# Patient Record
Sex: Male | Born: 1965 | Race: White | Hispanic: No | Marital: Married | Smoking: Former smoker
Health system: Southern US, Community
[De-identification: ages and names within clinical notes are randomized; demographics above are authoritative.]

## PROBLEM LIST (undated history)

## (undated) DIAGNOSIS — I509 Heart failure, unspecified: Secondary | ICD-10-CM

---

## 2020-05-09 DIAGNOSIS — I10 Essential (primary) hypertension: Secondary | ICD-10-CM

## 2020-05-09 DIAGNOSIS — R079 Chest pain, unspecified: Secondary | ICD-10-CM

## 2020-05-09 DIAGNOSIS — I361 Nonrheumatic tricuspid (valve) insufficiency: Secondary | ICD-10-CM | POA: Diagnosis not present

## 2020-05-09 DIAGNOSIS — G4733 Obstructive sleep apnea (adult) (pediatric): Secondary | ICD-10-CM | POA: Diagnosis not present

## 2020-05-09 DIAGNOSIS — J449 Chronic obstructive pulmonary disease, unspecified: Secondary | ICD-10-CM

## 2020-05-09 DIAGNOSIS — I272 Pulmonary hypertension, unspecified: Secondary | ICD-10-CM

## 2020-05-10 DIAGNOSIS — G4733 Obstructive sleep apnea (adult) (pediatric): Secondary | ICD-10-CM | POA: Diagnosis not present

## 2020-05-10 DIAGNOSIS — J449 Chronic obstructive pulmonary disease, unspecified: Secondary | ICD-10-CM | POA: Diagnosis not present

## 2020-05-10 DIAGNOSIS — I272 Pulmonary hypertension, unspecified: Secondary | ICD-10-CM | POA: Diagnosis not present

## 2020-05-10 DIAGNOSIS — R079 Chest pain, unspecified: Secondary | ICD-10-CM | POA: Diagnosis not present

## 2020-05-14 ENCOUNTER — Ambulatory Visit (HOSPITAL_COMMUNITY)
Admission: RE | Admit: 2020-05-14 | Discharge: 2020-05-14 | Disposition: A | Discharge status: Home / Self Care | Payer: 59 | Source: Ambulatory Visit | Attending: Internal Medicine | Admitting: Internal Medicine

## 2020-05-14 ENCOUNTER — Other Ambulatory Visit: Payer: Self-pay

## 2020-05-14 VITALS — BP 135/75 | HR 70 | Wt 350.6 lb

## 2020-05-14 DIAGNOSIS — R0602 Shortness of breath: Secondary | ICD-10-CM | POA: Insufficient documentation

## 2020-05-14 DIAGNOSIS — I2721 Secondary pulmonary arterial hypertension: Secondary | ICD-10-CM

## 2020-05-14 DIAGNOSIS — E119 Type 2 diabetes mellitus without complications: Secondary | ICD-10-CM | POA: Diagnosis not present

## 2020-05-14 DIAGNOSIS — Z7984 Long term (current) use of oral hypoglycemic drugs: Secondary | ICD-10-CM | POA: Insufficient documentation

## 2020-05-14 DIAGNOSIS — Z79899 Other long term (current) drug therapy: Secondary | ICD-10-CM | POA: Diagnosis not present

## 2020-05-14 DIAGNOSIS — I10 Essential (primary) hypertension: Secondary | ICD-10-CM | POA: Diagnosis not present

## 2020-05-14 DIAGNOSIS — I272 Pulmonary hypertension, unspecified: Secondary | ICD-10-CM

## 2020-05-14 DIAGNOSIS — G4733 Obstructive sleep apnea (adult) (pediatric): Secondary | ICD-10-CM | POA: Insufficient documentation

## 2020-05-14 DIAGNOSIS — Z87891 Personal history of nicotine dependence: Secondary | ICD-10-CM | POA: Diagnosis not present

## 2020-05-14 DIAGNOSIS — J449 Chronic obstructive pulmonary disease, unspecified: Secondary | ICD-10-CM | POA: Diagnosis not present

## 2020-05-14 LAB — BASIC METABOLIC PANEL
Anion gap: 8 (ref 5–15)
BUN: 23 mg/dL — ABNORMAL HIGH (ref 6–20)
CO2: 25 mmol/L (ref 22–32)
Calcium: 9.2 mg/dL (ref 8.9–10.3)
Chloride: 102 mmol/L (ref 98–111)
Creatinine, Ser: 1.03 mg/dL (ref 0.61–1.24)
GFR calc Af Amer: >60 mL/min (ref >60)
GFR calc non Af Amer: >60 mL/min (ref >60)
Glucose, Bld: 119 mg/dL — ABNORMAL HIGH (ref 70–99)
Potassium: 4.3 mmol/L (ref 3.5–5.1)
Sodium: 135 mmol/L (ref 135–145)

## 2020-05-14 LAB — CBC
HCT: 49.6 % (ref 39.0–52.0)
Hemoglobin: 15.3 g/dL (ref 13.0–17.0)
MCH: 27.7 pg (ref 26.0–34.0)
MCHC: 30.8 g/dL (ref 30.0–36.0)
MCV: 89.9 fL (ref 80.0–100.0)
Platelets: 224 10*3/uL (ref 150–400)
RBC: 5.52 MIL/uL (ref 4.22–5.81)
RDW: 16.5 % — ABNORMAL HIGH (ref 11.5–15.5)
WBC: 9.6 10*3/uL (ref 4.0–10.5)
nRBC: 0 % (ref 0.0–0.2)

## 2020-05-14 LAB — BRAIN NATRIURETIC PEPTIDE: B Natriuretic Peptide: 688.2 pg/mL — ABNORMAL HIGH (ref 0.0–100.0)

## 2020-05-14 NOTE — Progress Notes (Signed)
Pt ambulated in hallway, O2 sat remained 93% on RA with no desats noted.

## 2020-05-14 NOTE — Patient Instructions (Signed)
Labs done today, we will call you for abnormal results  Heart Catheterization scheduled for Thursday 7/29, see instructions below  Your physician recommends that you schedule a follow-up appointment in: 2-3 months  If you have any questions or concerns before your next appointment please send [place] a message through [place] or call our office at [phone].    TO LEAVE A MESSAGE FOR THE NURSE SELECT OPTION 2, PLEASE LEAVE A MESSAGE INCLUDING: . YOUR NAME . DATE OF BIRTH . CALL BACK NUMBER . REASON FOR CALL**this is important as we prioritize the call backs  YOU WILL RECEIVE A CALL BACK THE SAME DAY AS LONG AS YOU CALL BEFORE 4:00 PM  At the [facility], you and your health needs are our priority. As part of our continuing mission to provide you with exceptional heart care, we have created designated Provider Care Teams. These Care Teams include your primary Cardiologist (physician) and Advanced Practice Providers (APPs- Physician Assistants and Nurse Practitioners) who all work together to provide you with the care you need, when you need it.   You may see any of the following providers on your designated Care Team at your next follow up: Marland Kitchen Dr [name] . Dr Marca Ancona . Tonye Becket, NP . Robbie Lis, PA . Karle Plumber, PharmD   Please be sure to bring in all your medications bottles to every appointment.     CATHETERIZATION INSTRUCTIONS:  You are scheduled for a Cardiac Catheterization on Thursday, July 29 with Dr. [name].  1. Please arrive at the [facility] (Main Entrance A) at [facility]: [address] [place], [place] [address] at 11:30 AM (This time is two hours before your procedure to ensure your preparation). Free valet parking service is available.   Special note: Every effort is made to have your procedure done on time. Please understand that emergencies sometimes delay scheduled procedures.  2. Diet: Do not eat solid  foods after midnight.  The patient may have clear liquids until 5am upon the day of the procedure.  3. Labs: DONE TODAY  4. Medication instructions in preparation for your procedure:   THUR AM DO NOT TAKE:   FUROSEMIDE, SPIRONOLACTONE OR METFORMIN   DO NOT TAKE METFORMIN THUR, FRI, OR SAT, RESTART ON Sunday 8/1  On the morning of your procedure, take any morning medicines NOT listed above.  You may use sips of water.  5. Plan for one night stay--bring personal belongings. 6. Bring a current list of your medications and current insurance cards. 7. You MUST have a responsible person to drive you home. 8. Someone MUST be with you the first 24 hours after you arrive home or your discharge will be delayed. 9. Please wear clothes that are easy to get on and off and wear slip-on shoes.  Thank you for allowing [place] to care for you!   -- [facility]

## 2020-05-14 NOTE — Progress Notes (Signed)
ADVANCED HF CLINIC CONSULT NOTE  Referring Provider: Arnette Felts PA-C   HPI:  Bradley English is a 54 y/o male with HTN, morbid obesity, COPD, severe OSA (complaint with OSA), DM2, former tobacco use   referred by Arnette Felts PA-C for further evaluation of Pulmonary Hypertension found on echo.   Smoked 1.5 ppd x 30 years quit 1/21   Has worked in Landscape architect with re-upholstering. In October 2020 started to notice more LE swelling and SOB. Went to ER at [facility]. Had chest CT (negative for PE) and LE u/s negative.   Has been followed by Arnette Felts. BP much better controlled. Swelling improved but not resolved. Was admitted to [facility] last week with worsening dyspnea and CP. Troponin normal. Underwent IV diuresis.  Lasix increased by Dr. Dulce Sellar from 40 daily to 80 daily.  Echo 7/21: LVEF 55-60% RV severely dilated with moderately decreased function. Flattened septum   Says he gets SOB with any activity. When he is active starts feeling pressure in his neck and shoulders. If he continues gets presyncopal. Wears CPAP every night but has not done a download in > 6 months. Does not wear O2. Has a pulse ox and it is usually 91-93%. Edema much better. No h/o DVT/PE. Has always been overweight. A few years was 450 pounds and lost 100 pounds using an appetite suppressant (phenermine)          Review of Systems: [y] = yes, [ ]  = no   General: Weight gain [ ] ; Weight loss [ ] ; Anorexia [ ] ; Fatigue [ ] ; Fever [ ] ; Chills [ ] ; Weakness [ ]   Cardiac: Chest pain/pressure [ ] ; Resting SOB [ ] ; Exertional SOB [ ] ; Orthopnea [ ] ; Pedal Edema [ ] ; Palpitations [ ] ; Syncope [ ] ; Presyncope [ ] ; Paroxysmal nocturnal dyspnea[ ]   Pulmonary: Cough [ ] ; Wheezing[ ] ; Hemoptysis[ ] ; Sputum [ ] ; Snoring [ ]   GI: Vomiting[ ] ; Dysphagia[ ] ; Melena[ ] ; Hematochezia [ ] ; Heartburn[ ] ; Abdominal pain [ ] ; Constipation [ ] ; Diarrhea [ ] ; BRBPR [ ]   GU: Hematuria[ ] ; Dysuria [ ] ; Nocturia[ ]   Vascular:  Pain in legs with walking [ ] ; Pain in feet with lying flat [ ] ; Non-healing sores [ ] ; Stroke [ ] ; TIA [ ] ; Slurred speech [ ] ;  Neuro: Headaches[ ] ; Vertigo[ ] ; Seizures[ ] ; Paresthesias[ ] ;Blurred vision [ ] ; Diplopia [ ] ; Vision changes [ ]   Ortho/Skin: Arthritis [ ] ; Joint pain [ ] ; Muscle pain [ ] ; Joint swelling [ ] ; Back Pain [ ] ; Rash [ ]   Psych: Depression[ ] ; Anxiety[ ]   Heme: Bleeding problems [ ] ; Clotting disorders [ ] ; Anemia [ ]   Endocrine: Diabetes [ ] ; Thyroid dysfunction[ ]    PMHx:  1. Obesity 2. HTN 3. Pulmonary HTN with RV failure/cor pulmonale 4. Severe OSA  Current Outpatient Medications  Medication Sig Dispense Refill  . atorvastatin (LIPITOR) 10 MG tablet Take 1 tablet by mouth daily.    lisinopril (ZESTRIL) 20 MG tablet Take 1 tablet by mouth daily.    BREO ELLIPTA 100-25 MCG/INH AEPB Inhale 1 puff into the lungs daily.    . carvedilol (COREG) 12.5 MG tablet Take 12.5 mg by mouth 2 (two) times daily.    . furosemide (LASIX) 80 MG tablet Take 80 mg by mouth daily.    . metFORMIN (GLUCOPHAGE) 1000 MG tablet Take 1,000 mg by mouth 2 (two) times daily.    spironolactone (ALDACTONE) 50 MG tablet  Take 50 mg by mouth 2 (two) times daily.    . TRULICITY 1.5 MG/0.5ML SOPN SMARTSIG:1 Pre-Filled Pen Syringe SUB-Q Once a Week    . Vitamin D, Ergocalciferol, (DRISDOL) 1.25 MG (50000 UNIT) CAPS capsule Take 50,000 Units by mouth once a week.     No current facility-administered medications for this encounter.    No Known Allergies    Social History   Socioeconomic History  . Marital status: Married    Spouse name: Not on file  . Number of children: Not on file  . Years of education: Not on file  . Highest education level: Not on file  Occupational History  . Not on file  Tobacco Use  . Smoking status: Not on file  Substance and Sexual Activity  . Alcohol use: Not on file  . Drug use: Not on file  . Sexual activity: Not on file  Other Topics Concern    . Not on file  Social History Narrative  . Not on file   Social Determinants of Health   Financial Resource Strain:   . Difficulty of Paying Living Expenses:   Food Insecurity:   . Worried About Running Out of Food in the Last Year:   . Ran Out of Food in the Last Year:   Transportation Needs:   . Lack of Transportation (Medical):   . Lack of Transportation (Non-Medical):   Physical Activity:   . Days of Exercise per Week:   . Minutes of Exercise per Session:   Stress:   . Feeling of Stress :   Social Connections:   . Frequency of Communication with Friends and Family:   . Frequency of Social Gatherings with Friends and Family:   . Attends Religious Services:   . Active Member of Clubs or Organizations:   . Attends Club or Organization Meetings:   . Marital Status:   Intimate Partner Violence:   . Fear of Current or Ex-Partner:   . Emotionally Abused:   . Physically Abused:   . Sexually Abused:     FHx:  M died to cancer D died due to MVA PGF  Died to HF Brother stage IV kidney CA Sister died due to ETOH cirrhosis  Vitals:   05/14/20 1414  BP: (!) 135/75  Pulse: 70  SpO2: 94%  Weight: (!) 159 kg (350 lb 9.6 oz)    PHYSICAL EXAM: General:  Obese male . No respiratory difficulty HEENT: normal Neck: supple. JVP 8-10. Carotids 2+ bilat; no bruits. No lymphadenopathy or thryomegaly appreciated. Cor: PMI nondisplaced. Regular rate & rhythm. No rubs, gallops or murmurs. Lungs: clear Abdomen: obese soft, nontender, nondistended. No hepatosplenomegaly. No bruits or masses. Good bowel sounds. Extremities: no cyanosis, clubbing, rash, edema. prominent varicose veins Neuro: alert & oriented x 3, cranial nerves grossly intact. moves all 4 extremities w/o difficulty. Affect pleasant.    ASSESSMENT & PLAN:  1. Pulmonary HTN/cor pulmonale - suspect mostly WHO Group III but may  Have component of WHO Group 2 +/- anorexigen meds (Phenteramine) - CT chest and LE  dopplers negative for DVT/PE - continue CPAP - check sats with hall walk and overnight oximetry  - see if we can get PFTs from Pulmonary - will schedule R/L cath - Discussed need for weight loss with [place] diet.   2. Morbid obesity - Discussed need for weight loss with [place] diet.   3. HTN - Blood pressure well controlled. Continue current regimen.   

## 2020-05-14 NOTE — H&P (View-Only) (Signed)
ADVANCED HF CLINIC CONSULT NOTE  Referring Provider: Arnette Felts PA-C   HPI:  Bradley English is a 54 y/o male with HTN, morbid obesity, COPD, severe OSA (complaint with OSA), DM2, former tobacco use   referred by Arnette Felts PA-C for further evaluation of Pulmonary Hypertension found on echo.   Smoked 1.5 ppd x 30 years quit 1/21   Has worked in Landscape architect with re-upholstering. In October 2020 started to notice more LE swelling and SOB. Went to ER at [facility]. Had chest CT (negative for PE) and LE u/s negative.   Has been followed by Arnette Felts. BP much better controlled. Swelling improved but not resolved. Was admitted to [facility] last week with worsening dyspnea and CP. Troponin normal. Underwent IV diuresis.  Lasix increased by Dr. Dulce Sellar from 40 daily to 80 daily.  Echo 7/21: LVEF 55-60% RV severely dilated with moderately decreased function. Flattened septum   Says he gets SOB with any activity. When he is active starts feeling pressure in his neck and shoulders. If he continues gets presyncopal. Wears CPAP every night but has not done a download in > 6 months. Does not wear O2. Has a pulse ox and it is usually 91-93%. Edema much better. No h/o DVT/PE. Has always been overweight. A few years was 450 pounds and lost 100 pounds using an appetite suppressant (phenermine)          Review of Systems: [y] = yes, [ ]  = no   General: Weight gain [ ] ; Weight loss [ ] ; Anorexia [ ] ; Fatigue [ ] ; Fever [ ] ; Chills [ ] ; Weakness [ ]   Cardiac: Chest pain/pressure [ ] ; Resting SOB [ ] ; Exertional SOB [ ] ; Orthopnea [ ] ; Pedal Edema [ ] ; Palpitations [ ] ; Syncope [ ] ; Presyncope [ ] ; Paroxysmal nocturnal dyspnea[ ]   Pulmonary: Cough [ ] ; Wheezing[ ] ; Hemoptysis[ ] ; Sputum [ ] ; Snoring [ ]   GI: Vomiting[ ] ; Dysphagia[ ] ; Melena[ ] ; Hematochezia [ ] ; Heartburn[ ] ; Abdominal pain [ ] ; Constipation [ ] ; Diarrhea [ ] ; BRBPR [ ]   GU: Hematuria[ ] ; Dysuria [ ] ; Nocturia[ ]   Vascular:  Pain in legs with walking [ ] ; Pain in feet with lying flat [ ] ; Non-healing sores [ ] ; Stroke [ ] ; TIA [ ] ; Slurred speech [ ] ;  Neuro: Headaches[ ] ; Vertigo[ ] ; Seizures[ ] ; Paresthesias[ ] ;Blurred vision [ ] ; Diplopia [ ] ; Vision changes [ ]   Ortho/Skin: Arthritis [ ] ; Joint pain [ ] ; Muscle pain [ ] ; Joint swelling [ ] ; Back Pain [ ] ; Rash [ ]   Psych: Depression[ ] ; Anxiety[ ]   Heme: Bleeding problems [ ] ; Clotting disorders [ ] ; Anemia [ ]   Endocrine: Diabetes [ ] ; Thyroid dysfunction[ ]    PMHx:  1. Obesity 2. HTN 3. Pulmonary HTN with RV failure/cor pulmonale 4. Severe OSA  Current Outpatient Medications  Medication Sig Dispense Refill  . atorvastatin (LIPITOR) 10 MG tablet Take 1 tablet by mouth daily.    lisinopril (ZESTRIL) 20 MG tablet Take 1 tablet by mouth daily.    BREO ELLIPTA 100-25 MCG/INH AEPB Inhale 1 puff into the lungs daily.    . carvedilol (COREG) 12.5 MG tablet Take 12.5 mg by mouth 2 (two) times daily.    . furosemide (LASIX) 80 MG tablet Take 80 mg by mouth daily.    . metFORMIN (GLUCOPHAGE) 1000 MG tablet Take 1,000 mg by mouth 2 (two) times daily.    spironolactone (ALDACTONE) 50 MG tablet  Take 50 mg by mouth 2 (two) times daily.    . TRULICITY 1.5 MG/0.5ML SOPN SMARTSIG:1 Pre-Filled Pen Syringe SUB-Q Once a Week    . Vitamin D, Ergocalciferol, (DRISDOL) 1.25 MG (50000 UNIT) CAPS capsule Take 50,000 Units by mouth once a week.     No current facility-administered medications for this encounter.    No Known Allergies    Social History   Socioeconomic History  . Marital status: Married    Spouse name: Not on file  . Number of children: Not on file  . Years of education: Not on file  . Highest education level: Not on file  Occupational History  . Not on file  Tobacco Use  . Smoking status: Not on file  Substance and Sexual Activity  . Alcohol use: Not on file  . Drug use: Not on file  . Sexual activity: Not on file  Other Topics Concern    . Not on file  Social History Narrative  . Not on file   Social Determinants of Health   Financial Resource Strain:   . Difficulty of Paying Living Expenses:   Food Insecurity:   . Worried About Programme researcher, broadcasting/film/video in the Last Year:   . Barista in the Last Year:   Transportation Needs:   . Freight forwarder (Medical):   Marland Kitchen Lack of Transportation (Non-Medical):   Physical Activity:   . Days of Exercise per Week:   . Minutes of Exercise per Session:   Stress:   . Feeling of Stress :   Social Connections:   . Frequency of Communication with Friends and Family:   . Frequency of Social Gatherings with Friends and Family:   . Attends Religious Services:   . Active Member of Clubs or Organizations:   . Attends Banker Meetings:   Marland Kitchen Marital Status:   Intimate Partner Violence:   . Fear of Current or Ex-Partner:   . Emotionally Abused:   Marland Kitchen Physically Abused:   . Sexually Abused:     FHx:  M died to cancer D died due to MVA PGF  Died to HF Brother stage IV kidney CA Sister died due to ETOH cirrhosis  Vitals:   05/14/20 1414  BP: (!) 135/75  Pulse: 70  SpO2: 94%  Weight: (!) 159 kg (350 lb 9.6 oz)    PHYSICAL EXAM: General:  Obese male . No respiratory difficulty HEENT: normal Neck: supple. JVP 8-10. Carotids 2+ bilat; no bruits. No lymphadenopathy or thryomegaly appreciated. Cor: PMI nondisplaced. Regular rate & rhythm. No rubs, gallops or murmurs. Lungs: clear Abdomen: obese soft, nontender, nondistended. No hepatosplenomegaly. No bruits or masses. Good bowel sounds. Extremities: no cyanosis, clubbing, rash, edema. prominent varicose veins Neuro: alert & oriented x 3, cranial nerves grossly intact. moves all 4 extremities w/o difficulty. Affect pleasant.    ASSESSMENT & PLAN:  1. Pulmonary HTN/cor pulmonale - suspect mostly WHO Group III but may  Have component of WHO Group 2 +/- anorexigen meds (Phenteramine) - CT chest and LE  dopplers negative for DVT/PE - continue CPAP - check sats with hall walk and overnight oximetry  - see if we can get PFTs from Pulmonary - will schedule R/L cath - Discussed need for weight loss with [employer].   2. Morbid obesity - Discussed need for weight loss with [employer].   3. HTN - Blood pressure well controlled. Continue current regimen.

## 2020-05-16 ENCOUNTER — Other Ambulatory Visit: Payer: Self-pay

## 2020-05-16 ENCOUNTER — Encounter (HOSPITAL_COMMUNITY)
Admission: RE | Disposition: A | Discharge status: Home / Self Care | Payer: Self-pay | Source: Home / Self Care | Attending: Internal Medicine

## 2020-05-16 ENCOUNTER — Encounter: Payer: Self-pay | Admitting: *Deleted

## 2020-05-16 ENCOUNTER — Ambulatory Visit (HOSPITAL_COMMUNITY)
Admission: RE | Admit: 2020-05-16 | Discharge: 2020-05-16 | Disposition: A | Discharge status: Home / Self Care | Payer: 59 | Attending: Internal Medicine | Admitting: Internal Medicine

## 2020-05-16 DIAGNOSIS — Z6841 Body Mass Index (BMI) 40.0 and over, adult: Secondary | ICD-10-CM | POA: Diagnosis not present

## 2020-05-16 DIAGNOSIS — Z7951 Long term (current) use of inhaled steroids: Secondary | ICD-10-CM | POA: Diagnosis not present

## 2020-05-16 DIAGNOSIS — Z006 Encounter for examination for normal comparison and control in clinical research program: Secondary | ICD-10-CM

## 2020-05-16 DIAGNOSIS — Z79899 Other long term (current) drug therapy: Secondary | ICD-10-CM | POA: Diagnosis not present

## 2020-05-16 DIAGNOSIS — Z8249 Family history of ischemic heart disease and other diseases of the circulatory system: Secondary | ICD-10-CM | POA: Diagnosis not present

## 2020-05-16 DIAGNOSIS — Z87891 Personal history of nicotine dependence: Secondary | ICD-10-CM | POA: Diagnosis not present

## 2020-05-16 DIAGNOSIS — G4733 Obstructive sleep apnea (adult) (pediatric): Secondary | ICD-10-CM | POA: Insufficient documentation

## 2020-05-16 DIAGNOSIS — E119 Type 2 diabetes mellitus without complications: Secondary | ICD-10-CM | POA: Diagnosis not present

## 2020-05-16 DIAGNOSIS — J449 Chronic obstructive pulmonary disease, unspecified: Secondary | ICD-10-CM | POA: Diagnosis not present

## 2020-05-16 DIAGNOSIS — Z7984 Long term (current) use of oral hypoglycemic drugs: Secondary | ICD-10-CM | POA: Insufficient documentation

## 2020-05-16 DIAGNOSIS — I2721 Secondary pulmonary arterial hypertension: Secondary | ICD-10-CM | POA: Diagnosis not present

## 2020-05-16 DIAGNOSIS — R0789 Other chest pain: Secondary | ICD-10-CM | POA: Insufficient documentation

## 2020-05-16 DIAGNOSIS — I10 Essential (primary) hypertension: Secondary | ICD-10-CM | POA: Insufficient documentation

## 2020-05-16 HISTORY — PX: RIGHT/LEFT HEART CATH AND CORONARY ANGIOGRAPHY: CATH118266

## 2020-05-16 LAB — POCT I-STAT 7, (LYTES, BLD GAS, ICA,H+H)
Acid-base deficit: 2 mmol/L (ref 0.0–2.0)
Bicarbonate: 21.7 mmol/L (ref 20.0–28.0)
Calcium, Ion: 0.79 mmol/L — CL (ref 1.15–1.40)
HCT: 40 % (ref 39.0–52.0)
Hemoglobin: 13.6 g/dL (ref 13.0–17.0)
O2 Saturation: 97 %
Potassium: 3.9 mmol/L (ref 3.5–5.1)
Sodium: 146 mmol/L — ABNORMAL HIGH (ref 135–145)
TCO2: 23 mmol/L (ref 22–32)
pCO2 arterial: 32.3 mmHg (ref 32.0–48.0)
pH, Arterial: 7.436 (ref 7.350–7.450)
pO2, Arterial: 90 mmHg (ref 83.0–108.0)

## 2020-05-16 LAB — POCT I-STAT EG7
Acid-Base Excess: 2 mmol/L (ref 0.0–2.0)
Acid-Base Excess: 3 mmol/L — ABNORMAL HIGH (ref 0.0–2.0)
Bicarbonate: 27.4 mmol/L (ref 20.0–28.0)
Bicarbonate: 28.1 mmol/L — ABNORMAL HIGH (ref 20.0–28.0)
Calcium, Ion: 1.03 mmol/L — ABNORMAL LOW (ref 1.15–1.40)
Calcium, Ion: 1.12 mmol/L — ABNORMAL LOW (ref 1.15–1.40)
HCT: 46 % (ref 39.0–52.0)
HCT: 48 % (ref 39.0–52.0)
Hemoglobin: 15.6 g/dL (ref 13.0–17.0)
Hemoglobin: 16.3 g/dL (ref 13.0–17.0)
O2 Saturation: 59 %
O2 Saturation: 61 %
Potassium: 4.6 mmol/L (ref 3.5–5.1)
Potassium: 5.1 mmol/L (ref 3.5–5.1)
Sodium: 139 mmol/L (ref 135–145)
Sodium: 142 mmol/L (ref 135–145)
TCO2: 29 mmol/L (ref 22–32)
TCO2: 29 mmol/L (ref 22–32)
pCO2, Ven: 44.1 mmHg (ref 44.0–60.0)
pCO2, Ven: 44.8 mmHg (ref 44.0–60.0)
pH, Ven: 7.402 (ref 7.250–7.430)
pH, Ven: 7.405 (ref 7.250–7.430)
pO2, Ven: 31 mmHg — CL (ref 32.0–45.0)
pO2, Ven: 32 mmHg (ref 32.0–45.0)

## 2020-05-16 LAB — GLUCOSE, CAPILLARY: Glucose-Capillary: 140 mg/dL — ABNORMAL HIGH (ref 70–99)

## 2020-05-16 SURGERY — RIGHT/LEFT HEART CATH AND CORONARY ANGIOGRAPHY
Anesthesia: LOCAL

## 2020-05-16 MED ORDER — SODIUM CHLORIDE 0.9 % IV SOLN: INTRAVENOUS | Status: DC

## 2020-05-16 MED ORDER — VERAPAMIL HCL 2.5 MG/ML IV SOLN
INTRAVENOUS | Status: DC | PRN
  Administered 2020-05-16: 10 mL via INTRA_ARTERIAL

## 2020-05-16 MED ORDER — ONDANSETRON HCL 4 MG/2ML IJ SOLN: 4.0000 mg | Freq: Four times a day (QID) | INTRAMUSCULAR | Status: DC | PRN

## 2020-05-16 MED ORDER — HYDRALAZINE HCL 20 MG/ML IJ SOLN: 10.0000 mg | INTRAMUSCULAR | Status: DC | PRN

## 2020-05-16 MED ORDER — IOHEXOL 350 MG/ML SOLN
INTRAVENOUS | Status: DC | PRN
  Administered 2020-05-16: 80 mL

## 2020-05-16 MED ORDER — SODIUM CHLORIDE 0.9% FLUSH: 3.0000 mL | Freq: Two times a day (BID) | INTRAVENOUS | Status: DC

## 2020-05-16 MED ORDER — LIDOCAINE HCL (PF) 1 % IJ SOLN
INTRAMUSCULAR | Status: AC
  Filled 2020-05-16: qty 30

## 2020-05-16 MED ORDER — FENTANYL CITRATE (PF) 100 MCG/2ML IJ SOLN
INTRAMUSCULAR | Status: DC | PRN
  Administered 2020-05-16: 25 ug via INTRAVENOUS

## 2020-05-16 MED ORDER — HEPARIN SODIUM (PORCINE) 1000 UNIT/ML IJ SOLN
INTRAMUSCULAR | Status: AC
  Filled 2020-05-16: qty 1

## 2020-05-16 MED ORDER — SODIUM CHLORIDE 0.9% FLUSH: 3.0000 mL | INTRAVENOUS | Status: DC | PRN

## 2020-05-16 MED ORDER — SODIUM CHLORIDE 0.9 % IV SOLN: 250.0000 mL | INTRAVENOUS | Status: DC | PRN

## 2020-05-16 MED ORDER — LIDOCAINE HCL (PF) 1 % IJ SOLN
INTRAMUSCULAR | Status: DC | PRN
  Administered 2020-05-16: 2 mL
  Administered 2020-05-16: 3 mL

## 2020-05-16 MED ORDER — FENTANYL CITRATE (PF) 100 MCG/2ML IJ SOLN
INTRAMUSCULAR | Status: AC
  Filled 2020-05-16: qty 2

## 2020-05-16 MED ORDER — HEPARIN SODIUM (PORCINE) 1000 UNIT/ML IJ SOLN
INTRAMUSCULAR | Status: DC | PRN
  Administered 2020-05-16: 7000 [IU] via INTRAVENOUS

## 2020-05-16 MED ORDER — ASPIRIN 81 MG PO CHEW
81.0000 mg | CHEWABLE_TABLET | ORAL | Status: AC
  Administered 2020-05-16: 81 mg via ORAL
  Filled 2020-05-16: qty 1

## 2020-05-16 MED ORDER — ACETAMINOPHEN 325 MG PO TABS: 650.0000 mg | ORAL_TABLET | ORAL | Status: DC | PRN

## 2020-05-16 MED ORDER — HEPARIN (PORCINE) IN NACL 1000-0.9 UT/500ML-% IV SOLN
INTRAVENOUS | Status: DC | PRN
  Administered 2020-05-16 (×2): 500 mL

## 2020-05-16 MED ORDER — MIDAZOLAM HCL 2 MG/2ML IJ SOLN
INTRAMUSCULAR | Status: AC
  Filled 2020-05-16: qty 2

## 2020-05-16 MED ORDER — HEPARIN (PORCINE) IN NACL 1000-0.9 UT/500ML-% IV SOLN
INTRAVENOUS | Status: AC
  Filled 2020-05-16: qty 1000

## 2020-05-16 MED ORDER — LABETALOL HCL 5 MG/ML IV SOLN: 10.0000 mg | INTRAVENOUS | Status: DC | PRN

## 2020-05-16 MED ORDER — SILDENAFIL CITRATE 20 MG PO TABS: 20.0000 mg | ORAL_TABLET | Freq: Three times a day (TID) | ORAL | 3 refills | Status: DC

## 2020-05-16 MED ORDER — VERAPAMIL HCL 2.5 MG/ML IV SOLN
INTRAVENOUS | Status: AC
  Filled 2020-05-16: qty 2

## 2020-05-16 MED ORDER — MIDAZOLAM HCL 2 MG/2ML IJ SOLN
INTRAMUSCULAR | Status: DC | PRN
  Administered 2020-05-16: 1 mg via INTRAVENOUS

## 2020-05-16 SURGICAL SUPPLY — 10 items
CATH 5FR JL3.5 JR4 ANG PIG MP (CATHETERS) ×2 IMPLANT
CATH SWAN GANZ 7F STRAIGHT (CATHETERS) ×2 IMPLANT
DEVICE RAD COMP TR BAND LRG (VASCULAR PRODUCTS) ×2 IMPLANT
GLIDESHEATH SLEND SS 6F .021 (SHEATH) ×2 IMPLANT
GLIDESHEATH SLENDER 7FR .021G (SHEATH) ×2 IMPLANT
GUIDEWIRE INQWIRE 1.5J.035X260 (WIRE) ×1 IMPLANT
INQWIRE 1.5J .035X260CM (WIRE) ×2
PACK CARDIAC CATHETERIZATION (CUSTOM PROCEDURE TRAY) ×2 IMPLANT
TRANSDUCER W/STOPCOCK (MISCELLANEOUS) ×2 IMPLANT
TUBING CIL FLEX 10 FLL-RA (TUBING) IMPLANT

## 2020-05-16 NOTE — Interval H&P Note (Signed)
History and Physical Interval Note:  05/16/2020 1:01 PM  Bradley English  has presented today for surgery, with the diagnosis of pulmonary HTN & chest pain The various methods of treatment have been discussed with the patient and family. After consideration of risks, benefits and other options for treatment, the patient has consented to  Procedure(s): RIGHT/LEFT HEART CATH AND CORONARY ANGIOGRAPHY (N/A) and possible coronary angioplasty as a surgical intervention.  The patient's history has been reviewed, patient examined, no change in status, stable for surgery.  I have reviewed the patient's chart and labs.  Questions were answered to the patient's satisfaction.     Bradley English

## 2020-05-16 NOTE — Discharge Instructions (Signed)
HOLD METFORMIN// RESTART Sunday 8/1  Radial Site Care  This sheet gives you information about how to care for yourself after your procedure. Your health care provider may also give you more specific instructions. If you have problems or questions, contact your health care provider. What can I expect after the procedure? After the procedure, it is common to have:  Bruising and tenderness at the catheter insertion area. Follow these instructions at home: Medicines  Take over-the-counter and prescription medicines only as told by your health care provider. Insertion site care  Follow instructions from your health care provider about how to take care of your insertion site. Make sure you: ? Wash your hands with soap and water before you change your bandage (dressing). If soap and water are not available, use hand sanitizer. ? Change your dressing as told by your health care provider. ? Leave stitches (sutures), skin glue, or adhesive strips in place. These skin closures may need to stay in place for 2 weeks or longer. If adhesive strip edges start to loosen and curl up, you may trim the loose edges. Do not remove adhesive strips completely unless your health care provider tells you to do that.  Check your insertion site every day for signs of infection. Check for: ? Redness, swelling, or pain. ? Fluid or blood. ? Pus or a bad smell. ? Warmth.  Do not take baths, swim, or use a hot tub until your health care provider approves.  You may shower 24-48 hours after the procedure, or as directed by your health care provider. ? Remove the dressing and gently wash the site with plain soap and water. ? Pat the area dry with a clean towel. ? Do not rub the site. That could cause bleeding.  Do not apply powder or lotion to the site. Activity   For 24 hours after the procedure, or as directed by your health care provider: ? Do not flex or bend the affected arm. ? Do not push or pull heavy  objects with the affected arm. ? Do not drive yourself home from the hospital or clinic. You may drive 24 hours after the procedure unless your health care provider tells you not to. ? Do not operate machinery or power tools.  Do not lift anything that is heavier than 10 lb (4.5 kg), or the limit that you are told, until your health care provider says that it is safe.  Ask your health care provider when it is okay to: ? Return to work or school. ? Resume usual physical activities or sports. ? Resume sexual activity. General instructions  If the catheter site starts to bleed, raise your arm and put firm pressure on the site. If the bleeding does not stop, get help right away. This is a medical emergency.  If you went home on the same day as your procedure, a responsible adult should be with you for the first 24 hours after you arrive home.  Keep all follow-up visits as told by your health care provider. This is important. Contact a health care provider if:  You have a fever.  You have redness, swelling, or yellow drainage around your insertion site. Get help right away if:  You have unusual pain at the radial site.  The catheter insertion area swells very fast.  The insertion area is bleeding, and the bleeding does not stop when you hold steady pressure on the area.  Your arm or hand becomes pale, cool, tingly, or numb. These  symptoms may represent a serious problem that is an emergency. Do not wait to see if the symptoms will go away. Get medical help right away. Call your local emergency services (911 in the [place] Do not drive yourself to the hospital. Summary  After the procedure, it is common to have bruising and tenderness at the site.  Follow instructions from your health care provider about how to take care of your radial site wound. Check the wound every day for signs of infection.  Do not lift anything that is heavier than 10 lb (4.5 kg), or the limit that you are told,  until your health care provider says that it is safe. This information is not intended to replace advice given to you by your health care provider. Make sure you discuss any questions you have with your health care provider. Document Revised: 11/10/2017 Document Reviewed: 11/10/2017 Elsevier Patient Education  2020 [employer].

## 2020-05-16 NOTE — Progress Notes (Signed)
Patient and wife was given discharge instructions. Both verbalized understanding. 

## 2020-05-17 ENCOUNTER — Encounter (HOSPITAL_COMMUNITY): Payer: Self-pay | Admitting: Internal Medicine

## 2020-06-19 HISTORY — PX: MULTIPLE TOOTH EXTRACTIONS: SHX2053

## 2020-07-19 ENCOUNTER — Encounter (HOSPITAL_COMMUNITY): Payer: Self-pay | Admitting: Internal Medicine

## 2020-07-24 ENCOUNTER — Encounter (HOSPITAL_COMMUNITY): Payer: Self-pay | Admitting: Internal Medicine

## 2020-07-24 ENCOUNTER — Other Ambulatory Visit: Payer: Self-pay

## 2020-07-24 ENCOUNTER — Ambulatory Visit (HOSPITAL_COMMUNITY)
Admission: RE | Admit: 2020-07-24 | Discharge: 2020-07-24 | Disposition: A | Discharge status: Home / Self Care | Payer: 59 | Source: Ambulatory Visit | Attending: Internal Medicine | Admitting: Internal Medicine

## 2020-07-24 VITALS — BP 152/80 | HR 65 | Ht 68.0 in | Wt 357.6 lb

## 2020-07-24 DIAGNOSIS — Z7984 Long term (current) use of oral hypoglycemic drugs: Secondary | ICD-10-CM | POA: Insufficient documentation

## 2020-07-24 DIAGNOSIS — J449 Chronic obstructive pulmonary disease, unspecified: Secondary | ICD-10-CM | POA: Diagnosis not present

## 2020-07-24 DIAGNOSIS — I272 Pulmonary hypertension, unspecified: Secondary | ICD-10-CM | POA: Diagnosis not present

## 2020-07-24 DIAGNOSIS — Z8249 Family history of ischemic heart disease and other diseases of the circulatory system: Secondary | ICD-10-CM | POA: Insufficient documentation

## 2020-07-24 DIAGNOSIS — E119 Type 2 diabetes mellitus without complications: Secondary | ICD-10-CM | POA: Insufficient documentation

## 2020-07-24 DIAGNOSIS — Z7982 Long term (current) use of aspirin: Secondary | ICD-10-CM | POA: Diagnosis not present

## 2020-07-24 DIAGNOSIS — Z7951 Long term (current) use of inhaled steroids: Secondary | ICD-10-CM | POA: Diagnosis not present

## 2020-07-24 DIAGNOSIS — Z79899 Other long term (current) drug therapy: Secondary | ICD-10-CM | POA: Insufficient documentation

## 2020-07-24 DIAGNOSIS — Z87891 Personal history of nicotine dependence: Secondary | ICD-10-CM | POA: Insufficient documentation

## 2020-07-24 DIAGNOSIS — Z888 Allergy status to other drugs, medicaments and biological substances status: Secondary | ICD-10-CM | POA: Insufficient documentation

## 2020-07-24 DIAGNOSIS — G4733 Obstructive sleep apnea (adult) (pediatric): Secondary | ICD-10-CM | POA: Diagnosis not present

## 2020-07-24 DIAGNOSIS — I110 Hypertensive heart disease with heart failure: Secondary | ICD-10-CM | POA: Diagnosis present

## 2020-07-24 DIAGNOSIS — I5022 Chronic systolic (congestive) heart failure: Secondary | ICD-10-CM | POA: Diagnosis not present

## 2020-07-24 DIAGNOSIS — Z6841 Body Mass Index (BMI) 40.0 and over, adult: Secondary | ICD-10-CM | POA: Diagnosis not present

## 2020-07-24 MED ORDER — SILDENAFIL CITRATE 20 MG PO TABS: 40.0000 mg | ORAL_TABLET | Freq: Three times a day (TID) | ORAL | 3 refills | Status: DC

## 2020-07-24 NOTE — Patient Instructions (Addendum)
INCREASE Sildenifil 40mg  (2 tablets) Three times a day  Your physician recommends that you follow up with our office with an echocardiogram in 3-4 months  Your physician has requested that you have an echocardiogram. Echocardiography is a painless test that uses sound waves to create images of your heart. It provides your doctor with information about the size and shape of your heart and how well your heart's chambers and valves are working. This procedure takes approximately one hour. There are no restrictions for this procedure.   If you have any questions or concerns before your next appointment please send a message through [place] or call our office at [phone].    TO LEAVE A MESSAGE FOR THE NURSE SELECT OPTION 2, PLEASE LEAVE A MESSAGE INCLUDING: . YOUR NAME . DATE OF BIRTH . CALL BACK NUMBER . REASON FOR CALL**this is important as we prioritize the call backs  YOU WILL RECEIVE A CALL BACK THE SAME DAY AS LONG AS YOU CALL BEFORE 4:00 PM

## 2020-07-24 NOTE — Progress Notes (Addendum)
ADVANCED HF CLINIC NOTE  Referring Provider: Arnette Felts PA-C Pulmonary: [place]   HPI:  Bradley English is a 54 y/o male with HTN, morbid obesity, COPD, severe OSA (complaint with OSA), DM2, former tobacco use   referred by Arnette Felts PA-C for further evaluation of Pulmonary Hypertension found on echo.   Smoked 1.5 ppd x 30 years quit 1/21   Has worked in Landscape architect with re-upholstering. In October 2020 started to notice more LE swelling and SOB. Went to ER at [facility]. Had chest CT (negative for PE) and LE u/s negative.   Has been followed by Arnette Felts. BP much better controlled. Swelling improved but not resolved. Was admitted to [facility] last week with worsening dyspnea and CP. Troponin normal. Underwent IV diuresis.  Lasix increased by Dr. Dulce Sellar from 40 daily to 80 daily.  Echo 7/21: LVEF 55-60% RV severely dilated with moderately decreased function. Flattened septum   I saw him for the first time in 7/21 with Class IIIB symptoms and underwent R/L cath. 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 Sildenafil started. Still working. Has to take his time. Gets SOB if he tires to walk too fast. Gets very SOB with hills or 4-5 steps. He feels that the sildenafil has really helped. Uses CPAP every night. Only mild LE edema. No orthopnea or PND. Stopped lisinopril due to very low BP. PCP also stopped spiro. BP runs typically 115-120.   Cath 7/21  1. Normal coronary arteries with left dominant system and separate LAD and LCX ostia 2. LVEF 65% 3. Severe PAH  Ao =95/67 (80) LV = 100/12 RA = 17 RV = 88/23 PA = 90/39 (58) PCW = 8 Fick cardiac output/index = 4.5/1.7 Thermo CO/CI = 6.0/2.3 PVR = 8.2 WU FA sat = 97% PA sat = 59%, 61%  PFTs 11/20 FEV1 1.93 (57% FVC 2.66 (64%) FEF 25-75% 35% DLCO 63%    Current Outpatient Medications  Medication Sig Dispense Refill  . aspirin EC 81 MG tablet Take 81 mg by mouth daily. Swallow whole.    Marland Kitchen atorvastatin (LIPITOR) 10 MG  tablet Take 10 mg by mouth at bedtime.     Marland Kitchen BREO ELLIPTA 100-25 MCG/INH AEPB Inhale 1 puff into the lungs daily.    . metFORMIN (GLUCOPHAGE) 1000 MG tablet Take 1,000 mg by mouth 2 (two) times daily.    . sildenafil (REVATIO) 20 MG tablet Take 1 tablet (20 mg total) by mouth 3 (three) times daily. 90 tablet 3  . TRULICITY 1.5 MG/0.5ML SOPN Inhale 1.5 mg into the lungs once a week.     . Vitamin D, Ergocalciferol, (DRISDOL) 1.25 MG (50000 UNIT) CAPS capsule Take 50,000 Units by mouth once a week.    . carvedilol (COREG) 12.5 MG tablet Take 12.5 mg by mouth 2 (two) times daily.    . furosemide (LASIX) 80 MG tablet Take 80 mg by mouth daily.    Marland Kitchen lisinopril (ZESTRIL) 20 MG tablet Take 20 mg by mouth daily.  (Patient not taking: Reported on 07/24/2020)    . spironolactone (ALDACTONE) 50 MG tablet Take 50 mg by mouth 2 (two) times daily. (Patient not taking: Reported on 07/24/2020)     No current facility-administered medications for this encounter.    Allergies  Allergen Reactions  . Empagliflozin Rash      Social History   Socioeconomic History  . Marital status: Married    Spouse name: Not on file  . Number of children: Not on  file  . Years of education: Not on file  . Highest education level: Not on file  Occupational History  . Not on file  Tobacco Use  . Smoking status: Former Games developer  . Smokeless tobacco: Former Clinical biochemist  . Vaping Use: Never used  Substance and Sexual Activity  . Alcohol use: Not Currently  . Drug use: Not on file  . Sexual activity: Not on file  Other Topics Concern  . Not on file  Social History Narrative  . Not on file   Social Determinants of Health   Financial Resource Strain:   . Difficulty of Paying Living Expenses: Not on file  Food Insecurity:   . Worried About Programme researcher, broadcasting/film/video in the Last Year: Not on file  . Ran Out of Food in the Last Year: Not on file  Transportation Needs:   . Lack of Transportation (Medical): Not on file   . Lack of Transportation (Non-Medical): Not on file  Physical Activity:   . Days of Exercise per Week: Not on file  . Minutes of Exercise per Session: Not on file  Stress:   . Feeling of Stress : Not on file  Social Connections:   . Frequency of Communication with Friends and Family: Not on file  . Frequency of Social Gatherings with Friends and Family: Not on file  . Attends Religious Services: Not on file  . Active Member of Clubs or Organizations: Not on file  . Attends Banker Meetings: Not on file  . Marital Status: Not on file  Intimate Partner Violence:   . Fear of Current or Ex-Partner: Not on file  . Emotionally Abused: Not on file  . Physically Abused: Not on file  . Sexually Abused: Not on file    FHx:  M died to cancer D died due to MVA PGF  Died to HF Brother stage IV kidney CA Sister died due to ETOH cirrhosis  Vitals:   07/24/20 1438  BP: (!) 152/80  Pulse: 65  SpO2: 95%  Weight: (!) 162.2 kg (357 lb 9.6 oz)  Height: 5\' 8"  (1.727 m)   Wt Readings from Last 3 Encounters:  07/24/20 (!) 162.2 kg (357 lb 9.6 oz)  05/16/20 (!) 158.8 kg (350 lb)  05/14/20 (!) 159 kg (350 lb 9.6 oz)   PHYSICAL EXAM: General:  Obese male. No resp difficulty HEENT: normal Neck: supple. no JVD. Carotids 2+ bilat; no bruits. No lymphadenopathy or thryomegaly appreciated. Cor: PMI nondisplaced. Regular rate & rhythm. No rubs, gallops or murmurs. Lungs: clear Abdomen: soft, nontender, nondistended. No hepatosplenomegaly. No bruits or masses. Good bowel sounds. Extremities: no cyanosis, clubbing, rash, tr edema Neuro: alert & orientedx3, cranial nerves grossly intact. moves all 4 extremities w/o difficulty. Affect pleasant  ASSESSMENT & PLAN:  1. Pulmonary HTN/cor pulmonale - suspect mostly WHO Group III but may  Have component of WHO Group 2 +/- anorexigen meds (Phenteramine) - CT chest and LE dopplers negative for DVT/PE - Has severe OSA -> continue CPAP -  PFTs reviewed and reflect moderate restriction/obstruction - RHC 7/21 with severe PAH. PA 90/39 (58) PCWP 8 PVR 8.2 WU.  - Suspect WHO group I & III.  - NYHA II-III. Volume status ok - Improved with sildenafil 20 tid. Will increase to 40 tid.  - Repeat echo at next visit to reassess RV - Once again stressed need for weight loss  2. Morbid obesity - As above. Discussed low carb diet for weight  loss  - Goal weight for next visit 340-345  3. HTN - BP mildly elevated here but has been ok at home.   [name], MD  3:18 PM

## 2020-10-24 ENCOUNTER — Telehealth (HOSPITAL_COMMUNITY): Payer: Self-pay

## 2020-10-24 NOTE — Progress Notes (Signed)
ADVANCED HF CLINIC NOTE  Referring Provider: Arnette Felts, PA-C Pulmonary: Dr. Gerome Apley HF Cardiologist: Dr. Gala Romney  HPI:  Mr. Shaff is a 55 y/o male with HTN, morbid obesity, COPD, severe OSA, DM2, former tobacco use  referred by Arnette Felts PA-C for further evaluation of Pulmonary Hypertension found on echo.   Smoked 1.5 ppd x 30 years quit 1/21   Has worked in Landscape architect with re-upholstering. In 10/20 started to notice more LE swelling and SOB. Went to ER at [facility]. Had chest CT (negative for PE) and LE u/s negative.   Echo 7/21: LVEF 55-60% RV severely dilated with moderately decreased function. Flattened septum   I saw him for the first time in 7/21 with Class IIIB symptoms and underwent R/L cath. 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 Sildenafil started and felt like it really helped.   OV 10/21 Still working, gets SOB if he tires to walk too fast.Sildenafil increased to 40. Repeat Echo ordered.  Here today for HF follow up. Overall feels OK. Feels breathing is better with higher does of sildenafil. Still SOB with stairs and incline. Started getting HA with sildenafil increase, but manageable. Weight down 18 lbs, following [employer] Diet. Uses CPAP every night. No edema, CP, palpitations, or PND/orthopnea. Occasional dizziness. No syncope or presyncope.  Echo today 10/25/20: LVEF 65% RV markedly dilated and hypokinetic D-shaped septum.   Studies: Cath 7/21 1. Normal coronary arteries with left dominant system and separate LAD and LCX ostia 2. LVEF 65% 3. Severe PAH  Ao =95/67 (80) LV = 100/12 RA = 17 RV = 88/23 PA = 90/39 (58) PCW = 8 Fick cardiac output/index = 4.5/1.7 Thermo CO/CI = 6.0/2.3 PVR = 8.2 WU FA sat = 97% PA sat = 59%, 61%  PFTs 11/20 FEV1 1.93 (57% FVC 2.66 (64%) FEF 25-75% 35% DLCO 63%  Current Outpatient Medications  Medication Sig Dispense Refill  . aspirin EC 81 MG tablet Take 81 mg by mouth daily. Swallow whole.    Marland Kitchen  atorvastatin (LIPITOR) 10 MG tablet Take 10 mg by mouth at bedtime.     Marland Kitchen BREO ELLIPTA 100-25 MCG/INH AEPB Inhale 1 puff into the lungs daily.    . carvedilol (COREG) 12.5 MG tablet Take 12.5 mg by mouth 2 (two) times daily.    . furosemide (LASIX) 80 MG tablet Take 80 mg by mouth daily.    . metFORMIN (GLUCOPHAGE) 1000 MG tablet Take 1,000 mg by mouth 2 (two) times daily.    . sildenafil (REVATIO) 20 MG tablet Take 2 tablets (40 mg total) by mouth 3 (three) times daily. 180 tablet 3  . spironolactone (ALDACTONE) 50 MG tablet Take 50 mg by mouth 2 (two) times daily.    . TRULICITY 1.5 MG/0.5ML SOPN Inhale 1.5 mg into the lungs once a week.     . Vitamin D, Ergocalciferol, (DRISDOL) 1.25 MG (50000 UNIT) CAPS capsule Take 50,000 Units by mouth once a week.     No current facility-administered medications for this encounter.    Allergies  Allergen Reactions  . Empagliflozin Rash     Social History   Socioeconomic History  . Marital status: Married    Spouse name: Not on file  . Number of children: Not on file  . Years of education: Not on file  . Highest education level: Not on file  Occupational History  . Not on file  Tobacco Use  . Smoking status: Former Games developer  . Smokeless  tobacco: Former Clinical biochemist  . Vaping Use: Never used  Substance and Sexual Activity  . Alcohol use: Not Currently  . Drug use: Not on file  . Sexual activity: Not on file  Other Topics Concern  . Not on file  Social History Narrative  . Not on file   Social Determinants of Health   Financial Resource Strain: Not on file  Food Insecurity: Not on file  Transportation Needs: Not on file  Physical Activity: Not on file  Stress: Not on file  Social Connections: Not on file  Intimate Partner Violence: Not on file   FHx: M died to cancer D died due to MVA PGF  Died to HF Brother stage IV kidney CA Sister died due to ETOH cirrhosis  Vitals:   10/25/20 0940  BP: 110/70  Pulse: 76  SpO2:  94%  Weight: (!) 154 kg (339 lb 6.4 oz)   Wt Readings from Last 3 Encounters:  10/25/20 (!) 154 kg (339 lb 6.4 oz)  07/24/20 (!) 162.2 kg (357 lb 9.6 oz)  05/16/20 (!) 158.8 kg (350 lb)   PHYSICAL EXAM: General:  Obese male. No resp difficulty HEENT: Normal Neck: Supple. Thick neck, JVD difficult to assess. Carotids 2+ bilat; no bruits. No lymphadenopathy or thryomegaly appreciated. Cor: PMI nondisplaced. Regular rate & rhythm. No rubs, gallops or murmurs. Lungs: Clear Abdomen: Obese, soft, nontender, nondistended. No hepatosplenomegaly. No bruits or masses. Good bowel sounds. Extremities: No cyanosis, clubbing, rash, tr edema, varicose veins RLE. Neuro: Alert & oriented x 3, cranial nerves grossly intact. moves all 4 extremities w/o difficulty. Affect pleasant.  ASSESSMENT & PLAN:  1. Pulmonary HTN/cor pulmonale - suspect mostly WHO Group III but may  Have component of WHO Group 2 +/- anorexigen meds (Phenteramine). - Suspect WHO group I & III. - CT chest and LE dopplers negative for DVT/PE - Has severe OSA -> continue CPAP. - PFTs reviewed and reflect moderate restriction/obstruction. - RHC 7/21 with severe PAH. PA 90/39 (58) PCWP 8 PVR 8.2 WU.  - Echo today 10/25/20: EF 60-65%, RV markedly dilated and HK RV flattened septum, markedly dilated - NYHA II-III. Volume status ok. - Continue sildenafil to 40 tid.  - Add macitentan 10 mg daily. - today. RHC in 6 months - Once again stressed need for weight loss  2. Morbid obesity - Down 18 lbs. Congratulated on current weight loss. - As above. Discussed low carb diet for weight loss. Continue current efforts  3. HTN - Stable today. Continue current.  [name], MD  9:59 AM

## 2020-10-24 NOTE — Telephone Encounter (Signed)
Echocardiogram has been approved for date of service : 10/25/2020. Authorization # [id number]  (10/23/20-12/07/20)

## 2020-10-25 ENCOUNTER — Telehealth (HOSPITAL_COMMUNITY): Payer: Self-pay | Admitting: Pharmacy Technician

## 2020-10-25 ENCOUNTER — Telehealth (HOSPITAL_COMMUNITY): Payer: Self-pay | Admitting: Pharmacist

## 2020-10-25 ENCOUNTER — Ambulatory Visit (HOSPITAL_COMMUNITY)
Admission: RE | Admit: 2020-10-25 | Discharge: 2020-10-25 | Disposition: A | Discharge status: Home / Self Care | Payer: 59 | Source: Ambulatory Visit | Attending: Internal Medicine | Admitting: Internal Medicine

## 2020-10-25 ENCOUNTER — Ambulatory Visit (HOSPITAL_BASED_OUTPATIENT_CLINIC_OR_DEPARTMENT_OTHER)
Admission: RE | Admit: 2020-10-25 | Discharge: 2020-10-25 | Disposition: A | Discharge status: Home / Self Care | Payer: 59 | Source: Ambulatory Visit | Attending: Internal Medicine | Admitting: Internal Medicine

## 2020-10-25 ENCOUNTER — Other Ambulatory Visit: Payer: Self-pay

## 2020-10-25 ENCOUNTER — Encounter (HOSPITAL_COMMUNITY): Payer: Self-pay | Admitting: Internal Medicine

## 2020-10-25 DIAGNOSIS — I272 Pulmonary hypertension, unspecified: Secondary | ICD-10-CM | POA: Diagnosis not present

## 2020-10-25 DIAGNOSIS — I071 Rheumatic tricuspid insufficiency: Secondary | ICD-10-CM | POA: Insufficient documentation

## 2020-10-25 DIAGNOSIS — I5022 Chronic systolic (congestive) heart failure: Secondary | ICD-10-CM

## 2020-10-25 DIAGNOSIS — I110 Hypertensive heart disease with heart failure: Secondary | ICD-10-CM | POA: Diagnosis not present

## 2020-10-25 DIAGNOSIS — I509 Heart failure, unspecified: Secondary | ICD-10-CM | POA: Insufficient documentation

## 2020-10-25 DIAGNOSIS — I313 Pericardial effusion (noninflammatory): Secondary | ICD-10-CM | POA: Diagnosis not present

## 2020-10-25 LAB — BASIC METABOLIC PANEL
Anion gap: 13 (ref 5–15)
BUN: 49 mg/dL — ABNORMAL HIGH (ref 6–20)
CO2: 16 mmol/L — ABNORMAL LOW (ref 22–32)
Calcium: 9.5 mg/dL (ref 8.9–10.3)
Chloride: 103 mmol/L (ref 98–111)
Creatinine, Ser: 1.39 mg/dL — ABNORMAL HIGH (ref 0.61–1.24)
GFR, Estimated: >60 mL/min (ref >60)
Glucose, Bld: 170 mg/dL — ABNORMAL HIGH (ref 70–99)
Potassium: 5.5 mmol/L — ABNORMAL HIGH (ref 3.5–5.1)
Sodium: 132 mmol/L — ABNORMAL LOW (ref 135–145)

## 2020-10-25 LAB — BRAIN NATRIURETIC PEPTIDE: B Natriuretic Peptide: 867.7 pg/mL — ABNORMAL HIGH (ref 0.0–100.0)

## 2020-10-25 LAB — ECHOCARDIOGRAM COMPLETE
Area-P 1/2: 4.21 cm2
S' Lateral: 2.6 cm

## 2020-10-25 MED ORDER — MACITENTAN 10 MG PO TABS: 10.0000 mg | ORAL_TABLET | Freq: Every day | ORAL | 5 refills | Status: DC

## 2020-10-25 NOTE — Addendum Note (Signed)
Encounter addended by: Suezanne Cheshire, RN on: 10/25/2020 11:03 AM  Actions taken: Diagnosis association updated, Clinical Note Signed, Order list changed

## 2020-10-25 NOTE — Telephone Encounter (Signed)
Advanced Heart Failure Patient Advocate Encounter  Prior Authorization for Opsumit has been approved.    PA# [id number] Effective dates: 10/25/20 through 10/25/21  Karle Plumber, PharmD, BCPS, BCCP, CPP Heart Failure Clinic Pharmacist [phone]

## 2020-10-25 NOTE — Progress Notes (Signed)
  Echocardiogram 2D Echocardiogram has been performed.  Bradley English M 10/25/2020, 9:20 AM

## 2020-10-25 NOTE — Addendum Note (Signed)
Encounter addended by: Suezanne Cheshire, RN on: 10/25/2020 11:23 AM  Actions taken: Clinical Note Signed

## 2020-10-25 NOTE — Patient Instructions (Addendum)
Start Macitentan 10 mg daily    Labs done today, your results will be available in MyChart, we will contact you for abnormal readings.   Your physician recommends that you schedule a follow-up appointment in: 4 months  If you have any questions or concerns before your next appointment please send [place] a message through [place] or call our office at [phone].    TO LEAVE A MESSAGE FOR THE NURSE SELECT OPTION 2, PLEASE LEAVE A MESSAGE INCLUDING: . YOUR NAME . DATE OF BIRTH . CALL BACK NUMBER . REASON FOR CALL**this is important as we prioritize the call backs  YOU WILL RECEIVE A CALL BACK THE SAME DAY AS LONG AS YOU CALL BEFORE 4:00 PM   At the [facility] Failure [facility], you and your health needs are our priority. As part of our continuing mission to provide you with exceptional heart care, we have created designated Provider Care Teams. These Care Teams include your primary Cardiologist (physician) and Advanced Practice Providers (APPs- Physician Assistants and Nurse Practitioners) who all work together to provide you with the care you need, when you need it.   You may see any of the following providers on your designated Care Team at your next follow up: Marland Kitchen Dr [name] . Dr Marca Ancona . Tonye Becket, NP . Robbie Lis, PA . Karle Plumber, PharmD   Please be sure to bring in all your medications bottles to every appointment.

## 2020-10-25 NOTE — Addendum Note (Signed)
Encounter addended by: Crissie Figures, RN on: 10/25/2020 11:06 AM  Actions taken: Clinical Note Signed

## 2020-10-25 NOTE — Research (Signed)
[place] Informed Consent           Subject Name:   Bradley English    Subject met inclusion and exclusion criteria.  The informed consent form, study requirements and expectations were reviewed with the subject and questions and concerns were addressed prior to the signing of the consent form.  The subject verbalized understanding of the trial requirements.  The subject agreed to participate in the [facility] trial and signed the informed consent.  The informed consent was obtained prior to performance of any protocol-specific procedures for the subject.  A copy of the signed informed consent was given to the subject and a copy was placed in the subject's medical record. This patient was consented by Kay McChensney      [place] Yanni Quiroa, Research Assistant 05/16/20  12:05 p.m.

## 2020-10-25 NOTE — Progress Notes (Signed)
6 Min walk test.  Patient completed 6 min walk test. He walked approx 800 feet. Oxygen saturations were between 92-94 %.  His HR ranged between 76-89 BPM.

## 2020-10-25 NOTE — Telephone Encounter (Signed)
Patient Advocate Encounter   Received notification from [employer] that prior authorization for Opsumit is required.   PA submitted on CoverMyMeds Key [id number] Status is pending   Will continue to follow.   Karle Plumber, PharmD, BCPS, BCCP, CPP Heart Failure Clinic Pharmacist [phone]

## 2020-10-25 NOTE — Telephone Encounter (Signed)
Opsumit referral sent to [facility] along with PA approval via fax.

## 2020-10-30 ENCOUNTER — Telehealth (HOSPITAL_COMMUNITY): Payer: Self-pay

## 2020-10-30 DIAGNOSIS — I5022 Chronic systolic (congestive) heart failure: Secondary | ICD-10-CM

## 2020-10-30 MED ORDER — SPIRONOLACTONE 25 MG PO TABS: 25.0000 mg | ORAL_TABLET | Freq: Every day | ORAL | 3 refills | Status: DC

## 2020-10-30 NOTE — Telephone Encounter (Signed)
-----   Message from Dolores Patty, MD sent at 10/27/2020 12:45 PM EST ----- Please drop spiro to 25 daily and repeat BMET in 1 week

## 2020-10-30 NOTE — Telephone Encounter (Signed)
Patient advised and verbalized understanding. Lab appt scheduled,lab order entered, med list updated to reflect changes.   Orders Placed This Encounter  Procedures  . Basic Metabolic Panel (BMET)    Standing Status:   Future    Standing Expiration Date:   10/30/2021    Order Specific Question:   Release to patient    Answer:   Immediate   Meds ordered this encounter  Medications  . spironolactone (ALDACTONE) 25 MG tablet    Sig: Take 1 tablet (25 mg total) by mouth daily.    Dispense:  90 tablet    Refill:  3    Please cancel all previous orders for current medication. Change in dosage or pill size.

## 2020-11-06 ENCOUNTER — Ambulatory Visit (HOSPITAL_COMMUNITY)
Admission: RE | Admit: 2020-11-06 | Discharge: 2020-11-06 | Disposition: A | Discharge status: Home / Self Care | Payer: 59 | Source: Ambulatory Visit | Attending: Cardiology | Admitting: Cardiology

## 2020-11-06 ENCOUNTER — Other Ambulatory Visit: Payer: Self-pay

## 2020-11-06 DIAGNOSIS — I5022 Chronic systolic (congestive) heart failure: Secondary | ICD-10-CM | POA: Diagnosis not present

## 2020-11-06 LAB — BASIC METABOLIC PANEL
Anion gap: 16 — ABNORMAL HIGH (ref 5–15)
BUN: 51 mg/dL — ABNORMAL HIGH (ref 6–20)
CO2: 18 mmol/L — ABNORMAL LOW (ref 22–32)
Calcium: 9.6 mg/dL (ref 8.9–10.3)
Chloride: 99 mmol/L (ref 98–111)
Creatinine, Ser: 1.66 mg/dL — ABNORMAL HIGH (ref 0.61–1.24)
GFR, Estimated: 49 mL/min — ABNORMAL LOW (ref >60)
Glucose, Bld: 173 mg/dL — ABNORMAL HIGH (ref 70–99)
Potassium: 4.8 mmol/L (ref 3.5–5.1)
Sodium: 133 mmol/L — ABNORMAL LOW (ref 135–145)

## 2020-11-07 ENCOUNTER — Telehealth (HOSPITAL_COMMUNITY): Payer: Self-pay | Admitting: Pharmacist

## 2020-11-07 NOTE — Telephone Encounter (Signed)
Resubmitted Opsumit enrollment form to [facility].   Karle Plumber, PharmD, BCPS, BCCP, CPP Heart Failure Clinic Pharmacist [phone]

## 2020-11-12 ENCOUNTER — Encounter (HOSPITAL_COMMUNITY): Payer: Self-pay | Admitting: Cardiology

## 2020-11-13 ENCOUNTER — Telehealth (HOSPITAL_COMMUNITY): Payer: Self-pay | Admitting: Cardiology

## 2020-11-13 DIAGNOSIS — I5022 Chronic systolic (congestive) heart failure: Secondary | ICD-10-CM

## 2020-11-13 MED ORDER — FUROSEMIDE 40 MG PO TABS: 40.0000 mg | ORAL_TABLET | Freq: Every day | ORAL | 3 refills | Status: AC

## 2020-11-13 NOTE — Telephone Encounter (Signed)
Pt returned call to triage regarding lab results Results reviewed F/u labs 11/21/20

## 2020-11-21 ENCOUNTER — Other Ambulatory Visit: Payer: Self-pay

## 2020-11-21 ENCOUNTER — Ambulatory Visit (HOSPITAL_COMMUNITY)
Admission: RE | Admit: 2020-11-21 | Discharge: 2020-11-21 | Disposition: A | Discharge status: Home / Self Care | Payer: 59 | Source: Ambulatory Visit | Attending: Internal Medicine | Admitting: Internal Medicine

## 2020-11-21 DIAGNOSIS — I5022 Chronic systolic (congestive) heart failure: Secondary | ICD-10-CM | POA: Insufficient documentation

## 2020-11-21 LAB — BASIC METABOLIC PANEL
Anion gap: 8 (ref 5–15)
BUN: 42 mg/dL — ABNORMAL HIGH (ref 6–20)
CO2: 25 mmol/L (ref 22–32)
Calcium: 9.5 mg/dL (ref 8.9–10.3)
Chloride: 101 mmol/L (ref 98–111)
Creatinine, Ser: 1.5 mg/dL — ABNORMAL HIGH (ref 0.61–1.24)
GFR, Estimated: 55 mL/min — ABNORMAL LOW (ref >60)
Glucose, Bld: 116 mg/dL — ABNORMAL HIGH (ref 70–99)
Potassium: 5.1 mmol/L (ref 3.5–5.1)
Sodium: 134 mmol/L — ABNORMAL LOW (ref 135–145)

## 2020-11-22 ENCOUNTER — Other Ambulatory Visit (HOSPITAL_COMMUNITY): Payer: Self-pay

## 2020-11-22 MED ORDER — MACITENTAN 10 MG PO TABS: 10.0000 mg | ORAL_TABLET | Freq: Every day | ORAL | 5 refills | Status: DC

## 2020-11-28 ENCOUNTER — Telehealth (HOSPITAL_COMMUNITY): Payer: Self-pay | Admitting: Pharmacy Technician

## 2020-11-28 ENCOUNTER — Telehealth (HOSPITAL_COMMUNITY): Payer: Self-pay | Admitting: Pharmacist

## 2020-11-28 NOTE — Telephone Encounter (Signed)
Received a call from [place] today. The representative stated wanted to confirm that the patient is still receiving Opsumit. [facility] sent his referral back due to the patient being unreachable.  I confirmed that the patient is still on the medication. Called and left the patient's wife a message. They need to call [facility] to place a refill. Left the phone number to [facility] as well. Advised them to call back so I could go more in depth and if they had any questions.  [name], CPhT

## 2020-11-28 NOTE — Telephone Encounter (Signed)
Received message from Bradley English that Opsumit referral to [facility] had been terminated because they were unable to reach patient. I called patient and the specialty pharmacy was unable to reach him because he was not answering any calls from 1-800 numbers.   I called Bradley English [facility] and they have re-faxed the referral to [facility] and marked as Urgent. I also provided Bradley English with both the [facility] PAH number [id number]) and the Bradley English number [phone]). Patient will call [facility] tomorrow to schedule shipment.   Bradley English, PharmD, BCPS, BCCP, CPP Heart Failure Clinic Pharmacist [phone]

## 2020-11-29 ENCOUNTER — Other Ambulatory Visit (HOSPITAL_COMMUNITY): Payer: Self-pay

## 2020-11-29 MED ORDER — MACITENTAN 10 MG PO TABS: 10.0000 mg | ORAL_TABLET | Freq: Every day | ORAL | 5 refills | Status: DC

## 2020-12-16 ENCOUNTER — Telehealth (HOSPITAL_COMMUNITY): Payer: Self-pay | Admitting: Pharmacist

## 2020-12-16 MED ORDER — SILDENAFIL CITRATE 20 MG PO TABS: 40.0000 mg | ORAL_TABLET | Freq: Three times a day (TID) | ORAL | 11 refills | Status: DC

## 2020-12-16 NOTE — Telephone Encounter (Signed)
Sent refill of sildenafil to [facility].   Karle Plumber, PharmD, BCPS, BCCP, CPP Heart Failure Clinic Pharmacist [phone]

## 2020-12-23 ENCOUNTER — Telehealth (HOSPITAL_COMMUNITY): Payer: Self-pay | Admitting: Pharmacist

## 2020-12-23 NOTE — Telephone Encounter (Signed)
Patient Advocate Encounter   Received notification from [employer] that prior authorization for sildenafil is required.   PA submitted on CoverMyMeds Key [id number] Status is pending   Will continue to follow.   Karle Plumber, PharmD, BCPS, BCCP, CPP Heart Failure Clinic Pharmacist [phone]

## 2020-12-24 NOTE — Telephone Encounter (Signed)
Advanced Heart Failure Patient Advocate Encounter  Prior Authorization for sildenafil has been approved.    PA# [id number]. Effective dates: 12/23/20 through 12/23/21  Karle Plumber, PharmD, BCPS, BCCP, CPP Heart Failure Clinic Pharmacist [phone]

## 2021-02-25 NOTE — H&P (View-Only) (Signed)
ADVANCED HF CLINIC NOTE  Referring Provider: Arnette Felts, PA-C Pulmonary: Dr. Gerome Apley HF Cardiologist: Dr. Gala Romney  HPI:  Mr. Mecca is a 55 y/o male with HTN, morbid obesity, COPD, severe OSA, DM2, former tobacco use referred by Arnette Felts PA-C for further evaluation of pulmonary HTN   Smoked 1.5 ppd x 30 years quit 1/21   Has worked in Landscape architect with re-upholstering. In 10/20 started to notice more LE swelling and SOB. Went to ER at [facility]. Had chest CT (negative for PE) and LE u/s negative.   Echo 7/21: LVEF 55-60% RV severely dilated with moderately decreased function. Flattened septum   I saw him for the first time in 7/21 with Class IIIB symptoms and underwent R/L cath. 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 Sildenafil started and felt like it really helped.   Echo  10/25/20: LVEF 65% RV markedly dilated and hypokinetic D-shaped septum.   Macitentan added 1/22  Here for f/u. Now macitentan 10 and sildenafil 40 tid. Feels much better. Now able to weed eat and mowing the grass back to fishing. Occasionally dizzy if stands quickly. No edema, orthopnea or PND.   Studies:   1/22 225m (800 feet)   Cath 7/21 1. Normal coronary arteries with left dominant system and separate LAD and LCX ostia 2. LVEF 65% 3. Severe PAH  Ao =95/67 (80) LV = 100/12 RA = 17 RV = 88/23 PA = 90/39 (58) PCW = 8 Fick cardiac output/index = 4.5/1.7 Thermo CO/CI = 6.0/2.3 PVR = 8.2 WU FA sat = 97% PA sat = 59%, 61%  PFTs 11/20 FEV1 1.93 (57% FVC 2.66 (64%) FEF 25-75% 35% DLCO 63%  Current Outpatient Medications  Medication Sig Dispense Refill  . aspirin EC 81 MG tablet Take 81 mg by mouth daily. Swallow whole.    Marland Kitchen atorvastatin (LIPITOR) 10 MG tablet Take 10 mg by mouth at bedtime.     Marland Kitchen BREO ELLIPTA 100-25 MCG/INH AEPB Inhale 1 puff into the lungs daily.    . carvedilol (COREG) 12.5 MG tablet Take 12.5 mg by mouth 2 (two) times daily.    . furosemide (LASIX)  40 MG tablet Take 1 tablet (40 mg total) by mouth daily. 30 tablet 3  . macitentan (OPSUMIT) 10 MG tablet Take 1 tablet (10 mg total) by mouth daily. 30 tablet 5  . metFORMIN (GLUCOPHAGE) 1000 MG tablet Take 1,000 mg by mouth 2 (two) times daily.    . sildenafil (REVATIO) 20 MG tablet Take 2 tablets (40 mg total) by mouth 3 (three) times daily. 180 tablet 11  . spironolactone (ALDACTONE) 25 MG tablet Take 1 tablet (25 mg total) by mouth daily. 90 tablet 3  . TRULICITY 1.5 MG/0.5ML SOPN Inhale 1.5 mg into the lungs once a week.     . Vitamin D, Ergocalciferol, (DRISDOL) 1.25 MG (50000 UNIT) CAPS capsule Take 50,000 Units by mouth once a week.     No current facility-administered medications for this encounter.    Allergies  Allergen Reactions  . Empagliflozin Rash     Social History   Socioeconomic History  . Marital status: Married    Spouse name: Not on file  . Number of children: Not on file  . Years of education: Not on file  . Highest education level: Not on file  Occupational History  . Not on file  Tobacco Use  . Smoking status: Former Games developer  . Smokeless tobacco: Former Clinical biochemist  .  Vaping Use: Never used  Substance and Sexual Activity  . Alcohol use: Not Currently  . Drug use: Not on file  . Sexual activity: Not on file  Other Topics Concern  . Not on file  Social History Narrative  . Not on file   Social Determinants of Health   Financial Resource Strain: Not on file  Food Insecurity: Not on file  Transportation Needs: Not on file  Physical Activity: Not on file  Stress: Not on file  Social Connections: Not on file  Intimate Partner Violence: Not on file   FHx: M died to cancer D died due to MVA PGF  Died to HF Brother stage IV kidney CA Sister died due to ETOH cirrhosis  Vitals:   02/26/21 0951  BP: 110/70  Pulse: (!) 56  SpO2: 98%  Weight: (!) 153.6 kg (338 lb 9.6 oz)   Wt Readings from Last 3 Encounters:  02/26/21 (!) 153.6 kg (338  lb 9.6 oz)  10/25/20 (!) 154 kg (339 lb 6.4 oz)  07/24/20 (!) 162.2 kg (357 lb 9.6 oz)   PHYSICAL EXAM: General:  Obese male No resp difficulty HEENT: normal Neck: supple. no JVD. Carotids 2+ bilat; no bruits. No lymphadenopathy or thryomegaly appreciated. Cor: PMI nondisplaced. Regular rate & rhythm. No rubs, gallops or murmurs. Lungs: clear Abdomen: obese soft, nontender, nondistended. No hepatosplenomegaly. No bruits or masses. Good bowel sounds. Extremities: no cyanosis, clubbing, rash, edema Neuro: alert & orientedx3, cranial nerves grossly intact. moves all 4 extremities w/o difficulty. Affect pleasant  SB 57 RVH diffuse TWI Personally reviewed  ASSESSMENT & PLAN:  1. Pulmonary HTN/cor pulmonale - suspect mostly WHO Group III but may  Have component of WHO Group 2 +/- anorexigen meds (Phenteramine). - Suspect WHO group I & III. - CT chest and LE dopplers negative for DVT/PE - Has severe OSA -> continue CPAP. - PFTs reviewed and reflect moderate restriction/obstruction. - RHC 7/21 with severe PAH. PA 90/39 (58) PCWP 8 PVR 8.2 WU.  - Echo 10/25/20: EF 60-65%, RV markedly dilated and HK RV flattened septum, markedly dilated - 1/22 266m (800 feet)  - Much improved NYHA II Volume status ok. - Continue sildenafil 40 tid.  - Continue macitentan 10 mg daily. - Continue attempts at weight loss - Repeat RHC with eye toward adding selexipeg   2. Morbid obesity - Weight stable. Continue working on weight loss.   3. HTN - Stable today  [name], MD  10:17 PM

## 2021-02-25 NOTE — Progress Notes (Signed)
 ADVANCED HF CLINIC NOTE  Referring Provider: Mike Duran, PA-C Pulmonary: Dr. Buford HF Cardiologist: Dr. Cathlyn Tersigni  HPI:  Bradley English is a 54 y/o male with HTN, morbid obesity, COPD, severe OSA, DM2, former tobacco use referred by Mike Duran PA-C for further evaluation of pulmonary HTN   Smoked 1.5 ppd x 30 years quit 1/21   Has worked in furniture industry with re-upholstering. In 10/20 started to notice more LE swelling and SOB. Went to ER at [facility]. Had chest CT (negative for PE) and LE u/s negative.   Echo 7/21: LVEF 55-60% RV severely dilated with moderately decreased function. Flattened septum   I saw him for the first time in 7/21 with Class IIIB symptoms and underwent R/L cath. 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 Sildenafil started and felt like it really helped.   Echo  10/25/20: LVEF 65% RV markedly dilated and hypokinetic D-shaped septum.   Macitentan added 1/22  Here for f/u. Now macitentan 10 and sildenafil 40 tid. Feels much better. Now able to weed eat and mowing the grass back to fishing. Occasionally dizzy if stands quickly. No edema, orthopnea or PND.   Studies:   6MW 1/22 243m (800 feet)   Cath 7/21 1. Normal coronary arteries with left dominant system and separate LAD and LCX ostia 2. LVEF 65% 3. Severe PAH  Ao =95/67 (80) LV = 100/12 RA = 17 RV = 88/23 PA = 90/39 (58) PCW = 8 Fick cardiac output/index = 4.5/1.7 Thermo CO/CI = 6.0/2.3 PVR = 8.2 WU FA sat = 97% PA sat = 59%, 61%  PFTs 11/20 FEV1 1.93 (57% FVC 2.66 (64%) FEF 25-75% 35% DLCO 63%  Current Outpatient Medications  Medication Sig Dispense Refill  . aspirin EC 81 MG tablet Take 81 mg by mouth daily. Swallow whole.    . atorvastatin (LIPITOR) 10 MG tablet Take 10 mg by mouth at bedtime.     . BREO ELLIPTA 100-25 MCG/INH AEPB Inhale 1 puff into the lungs daily.    . carvedilol (COREG) 12.5 MG tablet Take 12.5 mg by mouth 2 (two) times daily.    . furosemide (LASIX)  40 MG tablet Take 1 tablet (40 mg total) by mouth daily. 30 tablet 3  . macitentan (OPSUMIT) 10 MG tablet Take 1 tablet (10 mg total) by mouth daily. 30 tablet 5  . metFORMIN (GLUCOPHAGE) 1000 MG tablet Take 1,000 mg by mouth 2 (two) times daily.    . sildenafil (REVATIO) 20 MG tablet Take 2 tablets (40 mg total) by mouth 3 (three) times daily. 180 tablet 11  . spironolactone (ALDACTONE) 25 MG tablet Take 1 tablet (25 mg total) by mouth daily. 90 tablet 3  . TRULICITY 1.5 MG/0.5ML SOPN Inhale 1.5 mg into the lungs once a week.     . Vitamin D, Ergocalciferol, (DRISDOL) 1.25 MG (50000 UNIT) CAPS capsule Take 50,000 Units by mouth once a week.     No current facility-administered medications for this encounter.    Allergies  Allergen Reactions  . Empagliflozin Rash     Social History   Socioeconomic History  . Marital status: Married    Spouse name: Not on file  . Number of children: Not on file  . Years of education: Not on file  . Highest education level: Not on file  Occupational History  . Not on file  Tobacco Use  . Smoking status: Former Smoker  . Smokeless tobacco: Former User  Vaping Use  .   Vaping Use: Never used  Substance and Sexual Activity  . Alcohol use: Not Currently  . Drug use: Not on file  . Sexual activity: Not on file  Other Topics Concern  . Not on file  Social History Narrative  . Not on file   Social Determinants of Health   Financial Resource Strain: Not on file  Food Insecurity: Not on file  Transportation Needs: Not on file  Physical Activity: Not on file  Stress: Not on file  Social Connections: Not on file  Intimate Partner Violence: Not on file   FHx: M died to cancer D died due to MVA PGF  Died to HF Brother stage IV kidney CA Sister died due to ETOH cirrhosis  Vitals:   02/26/21 0951  BP: 110/70  Pulse: (!) 56  SpO2: 98%  Weight: (!) 153.6 kg (338 lb 9.6 oz)   Wt Readings from Last 3 Encounters:  02/26/21 (!) 153.6 kg (338  lb 9.6 oz)  10/25/20 (!) 154 kg (339 lb 6.4 oz)  07/24/20 (!) 162.2 kg (357 lb 9.6 oz)   PHYSICAL EXAM: General:  Obese male No resp difficulty HEENT: normal Neck: supple. no JVD. Carotids 2+ bilat; no bruits. No lymphadenopathy or thryomegaly appreciated. Cor: PMI nondisplaced. Regular rate & rhythm. No rubs, gallops or murmurs. Lungs: clear Abdomen: obese soft, nontender, nondistended. No hepatosplenomegaly. No bruits or masses. Good bowel sounds. Extremities: no cyanosis, clubbing, rash, edema Neuro: alert & orientedx3, cranial nerves grossly intact. moves all 4 extremities w/o difficulty. Affect pleasant  SB 57 RVH diffuse TWI Personally reviewed  ASSESSMENT & PLAN:  1. Pulmonary HTN/cor pulmonale - suspect mostly WHO Group III but may  Have component of WHO Group 2 +/- anorexigen meds (Phenteramine). - Suspect WHO group I & III. - CT chest and LE dopplers negative for DVT/PE - Has severe OSA -> continue CPAP. - PFTs reviewed and reflect moderate restriction/obstruction. - RHC 7/21 with severe PAH. PA 90/39 (58) PCWP 8 PVR 8.2 WU.  - Echo 10/25/20: EF 60-65%, RV markedly dilated and HK RV flattened septum, markedly dilated - 1/22 266m (800 feet)  - Much improved NYHA II Volume status ok. - Continue sildenafil 40 tid.  - Continue macitentan 10 mg daily. - Continue attempts at weight loss - Repeat RHC with eye toward adding selexipeg   2. Morbid obesity - Weight stable. Continue working on weight loss.   3. HTN - Stable today  [name], MD  10:17 PM

## 2021-02-26 ENCOUNTER — Encounter (HOSPITAL_COMMUNITY): Payer: Self-pay | Admitting: Internal Medicine

## 2021-02-26 ENCOUNTER — Other Ambulatory Visit (HOSPITAL_COMMUNITY): Payer: Self-pay

## 2021-02-26 ENCOUNTER — Ambulatory Visit (HOSPITAL_COMMUNITY)
Admission: RE | Admit: 2021-02-26 | Discharge: 2021-02-26 | Disposition: A | Discharge status: Home / Self Care | Payer: 59 | Source: Ambulatory Visit | Attending: Internal Medicine | Admitting: Internal Medicine

## 2021-02-26 ENCOUNTER — Other Ambulatory Visit: Payer: Self-pay

## 2021-02-26 VITALS — BP 110/70 | HR 56 | Wt 338.6 lb

## 2021-02-26 DIAGNOSIS — I5032 Chronic diastolic (congestive) heart failure: Secondary | ICD-10-CM

## 2021-02-26 DIAGNOSIS — Z8249 Family history of ischemic heart disease and other diseases of the circulatory system: Secondary | ICD-10-CM | POA: Insufficient documentation

## 2021-02-26 DIAGNOSIS — Z87891 Personal history of nicotine dependence: Secondary | ICD-10-CM | POA: Diagnosis not present

## 2021-02-26 DIAGNOSIS — Z7951 Long term (current) use of inhaled steroids: Secondary | ICD-10-CM | POA: Insufficient documentation

## 2021-02-26 DIAGNOSIS — Z7982 Long term (current) use of aspirin: Secondary | ICD-10-CM | POA: Insufficient documentation

## 2021-02-26 DIAGNOSIS — J449 Chronic obstructive pulmonary disease, unspecified: Secondary | ICD-10-CM | POA: Diagnosis not present

## 2021-02-26 DIAGNOSIS — G4733 Obstructive sleep apnea (adult) (pediatric): Secondary | ICD-10-CM | POA: Diagnosis not present

## 2021-02-26 DIAGNOSIS — I272 Pulmonary hypertension, unspecified: Secondary | ICD-10-CM

## 2021-02-26 DIAGNOSIS — I110 Hypertensive heart disease with heart failure: Secondary | ICD-10-CM | POA: Diagnosis not present

## 2021-02-26 DIAGNOSIS — Z6841 Body Mass Index (BMI) 40.0 and over, adult: Secondary | ICD-10-CM | POA: Insufficient documentation

## 2021-02-26 DIAGNOSIS — Z79899 Other long term (current) drug therapy: Secondary | ICD-10-CM | POA: Diagnosis not present

## 2021-02-26 DIAGNOSIS — I5022 Chronic systolic (congestive) heart failure: Secondary | ICD-10-CM

## 2021-02-26 HISTORY — DX: Heart failure, unspecified: I50.9

## 2021-02-26 LAB — BASIC METABOLIC PANEL
Anion gap: 8 (ref 5–15)
BUN: 31 mg/dL — ABNORMAL HIGH (ref 6–20)
CO2: 23 mmol/L (ref 22–32)
Calcium: 9.2 mg/dL (ref 8.9–10.3)
Chloride: 104 mmol/L (ref 98–111)
Creatinine, Ser: 1.3 mg/dL — ABNORMAL HIGH (ref 0.61–1.24)
GFR, Estimated: >60 mL/min (ref >60)
Glucose, Bld: 126 mg/dL — ABNORMAL HIGH (ref 70–99)
Potassium: 5.2 mmol/L — ABNORMAL HIGH (ref 3.5–5.1)
Sodium: 135 mmol/L (ref 135–145)

## 2021-02-26 LAB — CBC
HCT: 38.5 % — ABNORMAL LOW (ref 39.0–52.0)
Hemoglobin: 12.2 g/dL — ABNORMAL LOW (ref 13.0–17.0)
MCH: 29.1 pg (ref 26.0–34.0)
MCHC: 31.7 g/dL (ref 30.0–36.0)
MCV: 91.9 fL (ref 80.0–100.0)
Platelets: 227 10*3/uL (ref 150–400)
RBC: 4.19 MIL/uL — ABNORMAL LOW (ref 4.22–5.81)
RDW: 13.4 % (ref 11.5–15.5)
WBC: 8.5 10*3/uL (ref 4.0–10.5)
nRBC: 0 % (ref 0.0–0.2)

## 2021-02-26 LAB — PROTIME-INR
INR: 1.1 (ref 0.8–1.2)
Prothrombin Time: 14.5 seconds (ref 11.4–15.2)

## 2021-02-26 LAB — BRAIN NATRIURETIC PEPTIDE: B Natriuretic Peptide: 587 pg/mL — ABNORMAL HIGH (ref 0.0–100.0)

## 2021-02-26 MED ORDER — SODIUM CHLORIDE 0.9% FLUSH: 3.0000 mL | Freq: Two times a day (BID) | INTRAVENOUS | Status: DC

## 2021-02-26 NOTE — Addendum Note (Signed)
Encounter addended by: Marisa Hua, RN on: 02/26/2021 10:57 AM  Actions taken: Visit diagnoses modified, Order list changed, Diagnosis association updated, Charge Capture section accepted, Clinical Note Signed

## 2021-02-26 NOTE — Patient Instructions (Addendum)
Labs today We will only contact you if something comes back abnormal or we need to make some changes. Otherwise no news is good news!  Your physician recommends that you schedule a follow-up appointment in: 4 months with Dr Gala Romney  Please call office at [phone] option [phone] if you have any questions or concerns.     [facility] [id number] [place] [address] Dept: [phone] Loc: [phone]  Krosby Ritchie  02/26/2021  You are scheduled for a Cardiac Catheterization on Monday, May 16 with Dr. [name].  1. Please arrive at the [facility] (Main Entrance A) at [facility]: [address] [place], [place] [address] at 8:30 AM (This time is two hours before your procedure to ensure your preparation). Free valet parking service is available.   Special note: Every effort is made to have your procedure done on time. Please understand that emergencies sometimes delay scheduled procedures.  2. Diet: Do not eat solid foods or drink after midnight.   3. Labs: Done today in office.  You will need a pre procedure COVID test     WHEN: Friday 02/28/21 at 11:45am WHERE: COVID Test Site     [address] [place], [place] [address]  This is a drive thru testing site, you will remain in your car. Be sure to get in the line FOR PROCEDURES Once you have been swabbed you will need to remain home in quarantine until you return for your procedure.  4. Medication instructions in preparation for your procedure:  DO NOT TAKE Lasix, Spironolactone and Glucophage on the morning of your procedure  On the morning of your procedure, take your Aspirin and any morning medicines NOT listed above.  You may use sips of water.  5. Plan for one night stay--bring personal belongings. 6. Bring a current list of your medications and current insurance cards. 7. You MUST have a responsible  person to drive you home. 8. Someone MUST be with you the first 24 hours after you arrive home or your discharge will be delayed. 9. Please wear clothes that are easy to get on and off and wear slip-on shoes.  Thank you for allowing [place] to care for you!   -- [facility]

## 2021-02-28 ENCOUNTER — Other Ambulatory Visit (HOSPITAL_COMMUNITY)
Admission: RE | Admit: 2021-02-28 | Discharge: 2021-02-28 | Disposition: A | Discharge status: Home / Self Care | Payer: 59 | Source: Ambulatory Visit | Attending: Internal Medicine | Admitting: Internal Medicine

## 2021-02-28 DIAGNOSIS — Z01812 Encounter for preprocedural laboratory examination: Secondary | ICD-10-CM | POA: Diagnosis not present

## 2021-02-28 DIAGNOSIS — Z20822 Contact with and (suspected) exposure to covid-19: Secondary | ICD-10-CM | POA: Diagnosis not present

## 2021-03-01 LAB — SARS CORONAVIRUS 2 (TAT 6-24 HRS): SARS Coronavirus 2: NEGATIVE

## 2021-03-03 ENCOUNTER — Ambulatory Visit (HOSPITAL_COMMUNITY)
Admission: RE | Admit: 2021-03-03 | Discharge: 2021-03-03 | Disposition: A | Discharge status: Home / Self Care | Payer: 59 | Attending: Internal Medicine | Admitting: Internal Medicine

## 2021-03-03 ENCOUNTER — Encounter (HOSPITAL_COMMUNITY): Payer: Self-pay | Admitting: Internal Medicine

## 2021-03-03 ENCOUNTER — Other Ambulatory Visit: Payer: Self-pay

## 2021-03-03 ENCOUNTER — Encounter (HOSPITAL_COMMUNITY)
Admission: RE | Disposition: A | Discharge status: Home / Self Care | Payer: Self-pay | Source: Home / Self Care | Attending: Internal Medicine

## 2021-03-03 DIAGNOSIS — Z7951 Long term (current) use of inhaled steroids: Secondary | ICD-10-CM | POA: Insufficient documentation

## 2021-03-03 DIAGNOSIS — J449 Chronic obstructive pulmonary disease, unspecified: Secondary | ICD-10-CM | POA: Insufficient documentation

## 2021-03-03 DIAGNOSIS — Z79899 Other long term (current) drug therapy: Secondary | ICD-10-CM | POA: Diagnosis not present

## 2021-03-03 DIAGNOSIS — I2721 Secondary pulmonary arterial hypertension: Secondary | ICD-10-CM | POA: Diagnosis present

## 2021-03-03 DIAGNOSIS — Z7982 Long term (current) use of aspirin: Secondary | ICD-10-CM | POA: Insufficient documentation

## 2021-03-03 DIAGNOSIS — Z87891 Personal history of nicotine dependence: Secondary | ICD-10-CM | POA: Diagnosis not present

## 2021-03-03 DIAGNOSIS — G4733 Obstructive sleep apnea (adult) (pediatric): Secondary | ICD-10-CM | POA: Insufficient documentation

## 2021-03-03 DIAGNOSIS — I10 Essential (primary) hypertension: Secondary | ICD-10-CM | POA: Diagnosis not present

## 2021-03-03 DIAGNOSIS — Z881 Allergy status to other antibiotic agents status: Secondary | ICD-10-CM | POA: Insufficient documentation

## 2021-03-03 DIAGNOSIS — Z6841 Body Mass Index (BMI) 40.0 and over, adult: Secondary | ICD-10-CM | POA: Diagnosis not present

## 2021-03-03 DIAGNOSIS — I5022 Chronic systolic (congestive) heart failure: Secondary | ICD-10-CM

## 2021-03-03 DIAGNOSIS — E119 Type 2 diabetes mellitus without complications: Secondary | ICD-10-CM | POA: Diagnosis not present

## 2021-03-03 DIAGNOSIS — Z7984 Long term (current) use of oral hypoglycemic drugs: Secondary | ICD-10-CM | POA: Insufficient documentation

## 2021-03-03 HISTORY — PX: RIGHT HEART CATH: CATH118263

## 2021-03-03 LAB — POCT I-STAT EG7
Acid-base deficit: 1 mmol/L (ref 0.0–2.0)
Acid-base deficit: 2 mmol/L (ref 0.0–2.0)
Bicarbonate: 23 mmol/L (ref 20.0–28.0)
Bicarbonate: 23.4 mmol/L (ref 20.0–28.0)
Calcium, Ion: 1.23 mmol/L (ref 1.15–1.40)
Calcium, Ion: 1.23 mmol/L (ref 1.15–1.40)
HCT: 37 % — ABNORMAL LOW (ref 39.0–52.0)
HCT: 37 % — ABNORMAL LOW (ref 39.0–52.0)
Hemoglobin: 12.6 g/dL — ABNORMAL LOW (ref 13.0–17.0)
Hemoglobin: 12.6 g/dL — ABNORMAL LOW (ref 13.0–17.0)
O2 Saturation: 70 %
O2 Saturation: 70 %
Potassium: 4.6 mmol/L (ref 3.5–5.1)
Potassium: 4.6 mmol/L (ref 3.5–5.1)
Sodium: 138 mmol/L (ref 135–145)
Sodium: 138 mmol/L (ref 135–145)
TCO2: 24 mmol/L (ref 22–32)
TCO2: 25 mmol/L (ref 22–32)
pCO2, Ven: 38.5 mmHg — ABNORMAL LOW (ref 44.0–60.0)
pCO2, Ven: 38.6 mmHg — ABNORMAL LOW (ref 44.0–60.0)
pH, Ven: 7.383 (ref 7.250–7.430)
pH, Ven: 7.39 (ref 7.250–7.430)
pO2, Ven: 37 mmHg (ref 32.0–45.0)
pO2, Ven: 37 mmHg (ref 32.0–45.0)

## 2021-03-03 SURGERY — RIGHT HEART CATH
Anesthesia: LOCAL

## 2021-03-03 MED ORDER — LIDOCAINE HCL (PF) 1 % IJ SOLN
INTRAMUSCULAR | Status: DC | PRN
  Administered 2021-03-03: 2 mL

## 2021-03-03 MED ORDER — SODIUM CHLORIDE 0.9% FLUSH: 3.0000 mL | Freq: Two times a day (BID) | INTRAVENOUS | Status: DC

## 2021-03-03 MED ORDER — HEPARIN (PORCINE) IN NACL 1000-0.9 UT/500ML-% IV SOLN
INTRAVENOUS | Status: AC
  Filled 2021-03-03: qty 500

## 2021-03-03 MED ORDER — ONDANSETRON HCL 4 MG/2ML IJ SOLN: 4.0000 mg | Freq: Four times a day (QID) | INTRAMUSCULAR | Status: DC | PRN

## 2021-03-03 MED ORDER — ACETAMINOPHEN 325 MG PO TABS: 650.0000 mg | ORAL_TABLET | ORAL | Status: DC | PRN

## 2021-03-03 MED ORDER — HYDRALAZINE HCL 20 MG/ML IJ SOLN: 10.0000 mg | INTRAMUSCULAR | Status: DC | PRN

## 2021-03-03 MED ORDER — SODIUM CHLORIDE 0.9 % IV SOLN: 250.0000 mL | INTRAVENOUS | Status: DC | PRN

## 2021-03-03 MED ORDER — SODIUM CHLORIDE 0.9% FLUSH: 3.0000 mL | INTRAVENOUS | Status: DC | PRN

## 2021-03-03 MED ORDER — HEPARIN (PORCINE) IN NACL 1000-0.9 UT/500ML-% IV SOLN
INTRAVENOUS | Status: DC | PRN
  Administered 2021-03-03: 500 mL

## 2021-03-03 MED ORDER — SODIUM CHLORIDE 0.9% FLUSH
3.0000 mL | INTRAVENOUS | Status: DC | PRN
Start: 2021-03-03 — End: 2021-03-03

## 2021-03-03 MED ORDER — LABETALOL HCL 5 MG/ML IV SOLN: 10.0000 mg | INTRAVENOUS | Status: DC | PRN

## 2021-03-03 MED ORDER — LIDOCAINE HCL (PF) 1 % IJ SOLN
INTRAMUSCULAR | Status: AC
  Filled 2021-03-03: qty 30

## 2021-03-03 MED ORDER — SODIUM CHLORIDE 0.9 % IV SOLN
250.0000 mL | INTRAVENOUS | Status: DC | PRN
Start: 2021-03-03 — End: 2021-03-03

## 2021-03-03 MED ORDER — SODIUM CHLORIDE 0.9 % IV SOLN: INTRAVENOUS | Status: DC

## 2021-03-03 SURGICAL SUPPLY — 8 items
CATH SWAN GANZ 7F STRAIGHT (CATHETERS) ×2 IMPLANT
GLIDESHEATH SLENDER 7FR .021G (SHEATH) ×2 IMPLANT
PACK CARDIAC CATHETERIZATION (CUSTOM PROCEDURE TRAY) ×2 IMPLANT
PROTECTION STATION PRESSURIZED (MISCELLANEOUS) ×2
STATION PROTECTION PRESSURIZED (MISCELLANEOUS) ×1 IMPLANT
TRANSDUCER W/STOPCOCK (MISCELLANEOUS) ×2 IMPLANT
TUBING ART PRESS 72  MALE/MALE (TUBING) ×2 IMPLANT
WIRE EMERALD 3MM-J .025X260CM (WIRE) ×2 IMPLANT

## 2021-03-03 NOTE — Discharge Instructions (Signed)
Brachial Site Care  This sheet gives you information about how to care for yourself after your procedure. Your health care provider may also give you more specific instructions. If you have problems or questions, contact your health care provider. What can I expect after the procedure? After the procedure, it is common to have:  Bruising and tenderness at the catheter insertion area. Follow these instructions at home: Medicines  Take over-the-counter and prescription medicines only as told by your health care provider. Insertion site care  Follow instructions from your health care provider about how to take care of your insertion site. Make sure you: ? Wash your hands with soap and water before you change your bandage (dressing). If soap and water are not available, use hand sanitizer. ? Change your dressing as told by your health care provider. ? Leave stitches (sutures), skin glue, or adhesive strips in place. These skin closures may need to stay in place for 2 weeks or longer. If adhesive strip edges start to loosen and curl up, you may trim the loose edges. Do not remove adhesive strips completely unless your health care provider tells you to do that.  Check your insertion site every day for signs of infection. Check for: ? Redness, swelling, or pain. ? Fluid or blood. ? Pus or a bad smell. ? Warmth.  Do not take baths, swim, or use a hot tub until your health care provider approves.  You may shower 24-48 hours after the procedure, or as directed by your health care provider. ? Remove the dressing and gently wash the site with plain soap and water. ? Pat the area dry with a clean towel. ? Do not rub the site. That could cause bleeding.  Do not apply powder or lotion to the site. Activity  For 24 hours after the procedure, or as directed by your health care provider: ? Do not flex or bend the affected arm. ? Do not push or pull heavy objects with the affected arm. ? Do not  drive yourself home from the hospital or clinic. You may drive 24 hours after the procedure unless your health care provider tells you not to. ? Do not operate machinery or power tools.  Do not lift anything that is heavier than 10 lb (4.5 kg), or the limit that you are told, until your health care provider says that it is safe.  Ask your health care provider when it is okay to: ? Return to work or school. ? Resume usual physical activities or sports. ? Resume sexual activity.   General instructions  If the catheter site starts to bleed, raise your arm and put firm pressure on the site. If the bleeding does not stop, get help right away. This is a medical emergency.  If you went home on the same day as your procedure, a responsible adult should be with you for the first 24 hours after you arrive home.  Keep all follow-up visits as told by your health care provider. This is important. Contact a health care provider if:  You have a fever.  You have redness, swelling, or yellow drainage around your insertion site. Get help right away if:  You have unusual pain at the radial site.  The catheter insertion area swells very fast.  The insertion area is bleeding, and the bleeding does not stop when you hold steady pressure on the area.  Your arm or hand becomes pale, cool, tingly, or numb. These symptoms may represent a serious  problem that is an emergency. Do not wait to see if the symptoms will go away. Get medical help right away. Call your local emergency services (911 in the [place] Do not drive yourself to the hospital. Summary  After the procedure, it is common to have bruising and tenderness at the site.  Follow instructions from your health care provider about how to take care of your radial site wound. Check the wound every day for signs of infection.  Do not lift anything that is heavier than 10 lb (4.5 kg), or the limit that you are told, until your health care provider says  that it is safe. This information is not intended to replace advice given to you by your health care provider. Make sure you discuss any questions you have with your health care provider. Document Revised: 11/10/2017 Document Reviewed: 11/10/2017 Elsevier Patient Education  2021 Elsevier Inc.  

## 2021-03-03 NOTE — Interval H&P Note (Signed)
History and Physical Interval Note:  03/03/2021 9:21 AM  Bradley English  has presented today for surgery, with the diagnosis of PAH  The various methods of treatment have been discussed with the patient and family. After consideration of risks, benefits and other options for treatment, the patient has consented to  Procedure(s): RIGHT HEART CATH (N/A) as a surgical intervention.  The patient's history has been reviewed, patient examined, no change in status, stable for surgery.  I have reviewed the patient's chart and labs.  Questions were answered to the patient's satisfaction.     [name]

## 2021-03-10 ENCOUNTER — Telehealth (HOSPITAL_COMMUNITY): Payer: Self-pay

## 2021-03-10 NOTE — Telephone Encounter (Signed)
Received a fax requesting medical records from [facility] . Records were successfully faxed to: [phone] ,which was the number provided.. Medical request form will be scanned into patients chart.   

## 2021-03-11 ENCOUNTER — Telehealth (HOSPITAL_COMMUNITY): Payer: Self-pay | Admitting: Pharmacy Technician

## 2021-03-11 ENCOUNTER — Other Ambulatory Visit (HOSPITAL_COMMUNITY): Payer: Self-pay

## 2021-03-11 NOTE — Telephone Encounter (Signed)
Advanced Heart Failure Patient Advocate Encounter  Patient being started on Uptravi, referral for [facility] started. Will fax in once signatures are obtained.

## 2021-03-21 ENCOUNTER — Telehealth (HOSPITAL_COMMUNITY): Payer: Self-pay | Admitting: Pharmacy Technician

## 2021-03-21 NOTE — Telephone Encounter (Signed)
Advanced Heart Failure Patient Advocate Encounter  Sent in [place] referral for [facility] via fax, [phone].   Will follow up.

## 2021-03-24 ENCOUNTER — Telehealth (HOSPITAL_COMMUNITY): Payer: Self-pay | Admitting: Pharmacy Technician

## 2021-03-24 NOTE — Telephone Encounter (Signed)
Advanced Heart Failure Patient Advocate Encounter  Prior Authorization for Uptravi 200/800 has been approved.    PA# [id number] Effective dates: 03/21/21 through 03/21/22  PA approval sent to [facility] and [facility].

## 2021-03-31 ENCOUNTER — Telehealth (HOSPITAL_COMMUNITY): Payer: Self-pay | Admitting: Pharmacy Technician

## 2021-03-31 NOTE — Telephone Encounter (Signed)
Advanced Heart Failure Patient Advocate Encounter  I received notice from [facility] that the patient was cleared for start of care on Uptravi, 6/6. Inquired a status update on how the patient is tolerating the medication or if assistance is still needed.   Will follow up.

## 2021-06-17 ENCOUNTER — Other Ambulatory Visit (HOSPITAL_COMMUNITY): Payer: Self-pay

## 2021-06-17 ENCOUNTER — Telehealth (HOSPITAL_COMMUNITY): Payer: Self-pay | Admitting: Pharmacy Technician

## 2021-06-17 NOTE — Telephone Encounter (Signed)
Advanced Heart Failure Patient Advocate Encounter  Received a notification that the patient needed an updated Uptravi PA. Insurance is requiring a quantity limit exception.   Called [employer] at [phone] and submitted request over the phone.   Request #: [id number]  Representative stated that a determination should be made within 4 days.

## 2021-06-19 NOTE — Telephone Encounter (Signed)
Advanced Heart Failure Patient Advocate Encounter  [employer] cancelled the request, stating that the medication is already covered.  Called [facility], confirmed RX went through and is set to ship out today.   [name], CPhT

## 2021-06-24 ENCOUNTER — Other Ambulatory Visit (HOSPITAL_COMMUNITY): Payer: Self-pay

## 2021-06-24 MED ORDER — UPTRAVI TITRATION 200 & 800 MCG PO TBPK: ORAL_TABLET | ORAL | 0 refills | Status: DC

## 2021-06-24 NOTE — Addendum Note (Signed)
Addended by: Noralee Space on: 06/24/2021 11:29 AM   Modules accepted: Orders

## 2021-06-25 ENCOUNTER — Telehealth (HOSPITAL_COMMUNITY): Payer: Self-pay | Admitting: Pharmacist

## 2021-06-25 NOTE — Telephone Encounter (Signed)
Provided verbal orders for Cordie Grice to [employer]. Insurance will not dispense titration pack, pharmacy must send new strength every time dose is increased.   Karle Plumber, PharmD, BCPS, BCCP, CPP Heart Failure Clinic Pharmacist [phone]

## 2021-06-26 ENCOUNTER — Encounter (HOSPITAL_COMMUNITY): Payer: Self-pay | Admitting: Internal Medicine

## 2021-06-26 ENCOUNTER — Ambulatory Visit (HOSPITAL_COMMUNITY)
Admission: RE | Admit: 2021-06-26 | Discharge: 2021-06-26 | Disposition: A | Discharge status: Home / Self Care | Payer: 59 | Source: Ambulatory Visit | Attending: Internal Medicine | Admitting: Internal Medicine

## 2021-06-26 ENCOUNTER — Other Ambulatory Visit: Payer: Self-pay

## 2021-06-26 DIAGNOSIS — E119 Type 2 diabetes mellitus without complications: Secondary | ICD-10-CM | POA: Diagnosis not present

## 2021-06-26 DIAGNOSIS — Z87891 Personal history of nicotine dependence: Secondary | ICD-10-CM | POA: Insufficient documentation

## 2021-06-26 DIAGNOSIS — I272 Pulmonary hypertension, unspecified: Secondary | ICD-10-CM | POA: Insufficient documentation

## 2021-06-26 DIAGNOSIS — Z7984 Long term (current) use of oral hypoglycemic drugs: Secondary | ICD-10-CM | POA: Insufficient documentation

## 2021-06-26 DIAGNOSIS — Z888 Allergy status to other drugs, medicaments and biological substances status: Secondary | ICD-10-CM | POA: Insufficient documentation

## 2021-06-26 DIAGNOSIS — I10 Essential (primary) hypertension: Secondary | ICD-10-CM

## 2021-06-26 DIAGNOSIS — J449 Chronic obstructive pulmonary disease, unspecified: Secondary | ICD-10-CM | POA: Diagnosis not present

## 2021-06-26 DIAGNOSIS — I110 Hypertensive heart disease with heart failure: Secondary | ICD-10-CM | POA: Diagnosis not present

## 2021-06-26 DIAGNOSIS — G4733 Obstructive sleep apnea (adult) (pediatric): Secondary | ICD-10-CM | POA: Diagnosis not present

## 2021-06-26 DIAGNOSIS — Z8249 Family history of ischemic heart disease and other diseases of the circulatory system: Secondary | ICD-10-CM | POA: Diagnosis not present

## 2021-06-26 DIAGNOSIS — Z79899 Other long term (current) drug therapy: Secondary | ICD-10-CM | POA: Diagnosis not present

## 2021-06-26 DIAGNOSIS — I5032 Chronic diastolic (congestive) heart failure: Secondary | ICD-10-CM | POA: Insufficient documentation

## 2021-06-26 DIAGNOSIS — Z7982 Long term (current) use of aspirin: Secondary | ICD-10-CM | POA: Diagnosis not present

## 2021-06-26 LAB — CBC
HCT: 38.5 % — ABNORMAL LOW (ref 39.0–52.0)
Hemoglobin: 12.1 g/dL — ABNORMAL LOW (ref 13.0–17.0)
MCH: 28.5 pg (ref 26.0–34.0)
MCHC: 31.4 g/dL (ref 30.0–36.0)
MCV: 90.6 fL (ref 80.0–100.0)
Platelets: 231 10*3/uL (ref 150–400)
RBC: 4.25 MIL/uL (ref 4.22–5.81)
RDW: 14.9 % (ref 11.5–15.5)
WBC: 9.4 10*3/uL (ref 4.0–10.5)
nRBC: 0 % (ref 0.0–0.2)

## 2021-06-26 LAB — COMPREHENSIVE METABOLIC PANEL
ALT: 17 U/L (ref 0–44)
AST: 18 U/L (ref 15–41)
Albumin: 4.1 g/dL (ref 3.5–5.0)
Alkaline Phosphatase: 82 U/L (ref 38–126)
Anion gap: 7 (ref 5–15)
BUN: 28 mg/dL — ABNORMAL HIGH (ref 6–20)
CO2: 21 mmol/L — ABNORMAL LOW (ref 22–32)
Calcium: 9.4 mg/dL (ref 8.9–10.3)
Chloride: 110 mmol/L (ref 98–111)
Creatinine, Ser: 1.28 mg/dL — ABNORMAL HIGH (ref 0.61–1.24)
GFR, Estimated: >60 mL/min (ref >60)
Glucose, Bld: 123 mg/dL — ABNORMAL HIGH (ref 70–99)
Potassium: 4.5 mmol/L (ref 3.5–5.1)
Sodium: 138 mmol/L (ref 135–145)
Total Bilirubin: 0.9 mg/dL (ref 0.3–1.2)
Total Protein: 7.3 g/dL (ref 6.5–8.1)

## 2021-06-26 LAB — BRAIN NATRIURETIC PEPTIDE: B Natriuretic Peptide: 535.2 pg/mL — ABNORMAL HIGH (ref 0.0–100.0)

## 2021-06-26 NOTE — Patient Instructions (Signed)
Labs done today, we will call you for abnormal results  Your physician recommends that you schedule a follow-up appointment in: 3 months with an echocardiogram  If you have any questions or concerns before your next appointment please send [place] a message through [place] or call our office at [phone].    TO LEAVE A MESSAGE FOR THE NURSE SELECT OPTION 2, PLEASE LEAVE A MESSAGE INCLUDING: YOUR NAME DATE OF BIRTH CALL BACK NUMBER REASON FOR CALL**this is important as we prioritize the call backs  YOU WILL RECEIVE A CALL BACK THE SAME DAY AS LONG AS YOU CALL BEFORE 4:00 PM  At the [facility] Failure [facility], you and your health needs are our priority. As part of our continuing mission to provide you with exceptional heart care, we have created designated Provider Care Teams. These Care Teams include your primary Cardiologist (physician) and Advanced Practice Providers (APPs- Physician Assistants and Nurse Practitioners) who all work together to provide you with the care you need, when you need it.   You may see any of the following providers on your designated Care Team at your next follow up: Dr [name] Dr Marca Ancona Dr Brandon Melnick, NP Robbie Lis, [place] Mikki Santee Karle Plumber, PharmD   Please be sure to bring in all your medications bottles to every appointment.

## 2021-06-26 NOTE — Addendum Note (Signed)
Encounter addended by: Suezanne Cheshire, RN on: 06/26/2021 12:06 PM  Actions taken: Clinical Note Signed

## 2021-06-26 NOTE — Progress Notes (Signed)
ADVANCED HF CLINIC NOTE  Referring Provider: Arnette Felts, PA-C Pulmonary: Dr. Gerome Apley HF Cardiologist: Dr. Gala Romney  HPI:  Bradley English is a 55 y/o male with HTN, morbid obesity, COPD, severe OSA, DM2, former tobacco use referred by Arnette Felts PA-C for further evaluation of pulmonary HTN   Smoked 1.5 ppd x 30 years quit 1/21   Has worked in Landscape architect with re-upholstering. In 10/20 started to notice more LE swelling and SOB. Went to ER at [facility]. Had chest CT (negative for PE) and LE u/s negative.   Echo 7/21: LVEF 55-60% RV severely dilated with moderately decreased function. Flattened septum   I saw him for the first time in 7/21 with Class IIIB symptoms and underwent R/L cath. 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 Sildenafil started and felt like it really helped.   Echo  10/25/20: LVEF 65% RV markedly dilated and hypokinetic D-shaped septum.   Macitentan added 1/22  RHC 5/22  RA = 11 RV = 101/14 PA = 100/34 (58) PCW = 17 Fick cardiac output/index = 9.0/3.5 PVR = 4.6 WU Ao sat = 93% PA sat = 69%, 70%  -> selexipag added  Here for f/u. Now on macitentan 10, sildenafil 40 tid and selexipag 200 bid. Says he got up to 400 bid and he couldn't tolerate it due to severe leg pains. Tolerating 200 bid well. Says he feels much better. Breathing better. Able to push mow his yard. Says HR coming down. Denies edema, orthopnea or PND. No syncope/presyncope. Lost 20 pounds. Walking more. Using elliptical. Still working FT as Patent attorney.    Studies:   1/22 28m (800 feet)   Cath 7/21 1. Normal coronary arteries with left dominant system and separate LAD and LCX ostia 2. LVEF 65% 3. Severe PAH  Ao =95/67 (80) LV = 100/12 RA = 17 RV = 88/23 PA = 90/39 (58) PCW = 8 Fick cardiac output/index = 4.5/1.7 Thermo CO/CI = 6.0/2.3 PVR = 8.2 WU FA sat = 97% PA sat = 59%, 61%  PFTs 11/20 FEV1 1.93 (57% FVC 2.66 (64%) FEF 25-75% 35% DLCO  63%  Current Outpatient Medications  Medication Sig Dispense Refill   aspirin EC 81 MG tablet Take 81 mg by mouth in the morning. Swallow whole.     atorvastatin (LIPITOR) 10 MG tablet Take 10 mg by mouth at bedtime.      carvedilol (COREG) 12.5 MG tablet Take 12.5 mg by mouth 2 (two) times daily.     furosemide (LASIX) 40 MG tablet Take 1 tablet (40 mg total) by mouth daily. 30 tablet 3   ibuprofen (ADVIL) 200 MG tablet Take 800 mg by mouth every 8 (eight) hours as needed (headache).     macitentan (OPSUMIT) 10 MG tablet Take 1 tablet (10 mg total) by mouth daily. 30 tablet 5   metFORMIN (GLUCOPHAGE) 500 MG tablet Take 500 mg by mouth 2 (two) times daily with a meal.     Selexipag (UPTRAVI) 200 & 800 MCG TBPK Start 200 mcg Twice daily, up titrate per pharmacy 200 each 0   sildenafil (REVATIO) 20 MG tablet Take 2 tablets (40 mg total) by mouth 3 (three) times daily. 180 tablet 11   spironolactone (ALDACTONE) 25 MG tablet Take 1 tablet (25 mg total) by mouth daily. 90 tablet 3   Vitamin D, Ergocalciferol, (DRISDOL) 1.25 MG (50000 UNIT) CAPS capsule Take 50,000 Units by mouth once a week.     No current  facility-administered medications for this encounter.    Allergies  Allergen Reactions   Empagliflozin Rash    Jardiance     Social History   Socioeconomic History   Marital status: Married    Spouse name: Not on file   Number of children: Not on file   Years of education: Not on file   Highest education level: Not on file  Occupational History   Not on file  Tobacco Use   Smoking status: Former   Smokeless tobacco: Former  Building services engineer Use: Never used  Substance and Sexual Activity   Alcohol use: Not Currently   Drug use: Not on file   Sexual activity: Not on file  Other Topics Concern   Not on file  Social History Narrative   Not on file   Social Determinants of Health   Financial Resource Strain: Not on file  Food Insecurity: Not on file  Transportation  Needs: Not on file  Physical Activity: Not on file  Stress: Not on file  Social Connections: Not on file  Intimate Partner Violence: Not on file   FHx: M died to cancer D died due to MVA PGF  Died to HF Brother stage IV kidney CA Sister died due to ETOH cirrhosis  Vitals:   06/26/21 1025  BP: 120/70  Pulse: (!) 58  SpO2: 94%  Weight: (!) 144.8 kg (319 lb 3.2 oz)   Wt Readings from Last 3 Encounters:  06/26/21 (!) 144.8 kg (319 lb 3.2 oz)  03/03/21 (!) 153.3 kg (338 lb)  02/26/21 (!) 153.6 kg (338 lb 9.6 oz)   PHYSICAL EXAM: General:  Obese male No resp difficulty HEENT: normal Neck: supple. no JVD. Carotids 2+ bilat; no bruits. No lymphadenopathy or thryomegaly appreciated. Cor: PMI nondisplaced. Regular rate & rhythm. No rubs, gallops or murmurs. Lungs: clear Abdomen: obese soft, nontender, nondistended. No hepatosplenomegaly. No bruits or masses. Good bowel sounds. Extremities: no cyanosis, clubbing, rash, edema. Severe varicose veins  Neuro: alert & orientedx3, cranial nerves grossly intact. moves all 4 extremities w/o difficulty. Affect pleasant   ASSESSMENT & PLAN:  1. Pulmonary HTN/cor pulmonale - suspect mostly WHO Group III but may  Have component of WHO Group 2 +/- anorexigen meds (Phenteramine). - Suspect WHO group I & III. - CT chest and LE dopplers negative for DVT/PE - Has severe OSA -> continue CPAP. - PFTs reviewed and reflect moderate restriction/obstruction. - RHC 7/21 with severe PAH. PA 90/39 (58) PCWP 8 PVR 8.2 WU.  - Echo 10/25/20: EF 60-65%, RV markedly dilated and HK RV flattened septum, markedly dilated - Repeat RHC 5/22: PA = 100/34 (58) PCW = 17 Fick cardiac output/index = 9.0/3.5 PVR = 4.6 WU - 1/22 282m (800 feet)  - Much improved II. Volume status ok. - Now sildenafil 40 tid, macitentan 10 mg daily ans selex 200 bid  - Consider pushing selex to 200/400 if tolerated - today. Repeat echo at next visit in 3 months - Labs today  2.  Morbid obesity - Has lost 20 pounds. Encouraged to continue - Set a goal for him for under 300 pounds  3. HTN - BP looks good.   4. Severe OSA - compliant with CPAP  [name], MD  11:03 AM

## 2021-06-26 NOTE — Addendum Note (Signed)
Encounter addended by: Noralee Space, RN on: 06/26/2021 12:02 PM  Actions taken: Visit diagnoses modified, Clinical Note Signed, Charge Capture section accepted, Order list changed, Diagnosis association updated

## 2021-06-26 NOTE — Progress Notes (Signed)
6 Min Walk Test Completed  Pt ambulated 426.72 meters O2 Sat ranged 90%-96% on room air HR ranged 82-101

## 2021-09-29 ENCOUNTER — Telehealth (HOSPITAL_COMMUNITY): Payer: Self-pay | Admitting: Pharmacy Technician

## 2021-09-29 NOTE — Telephone Encounter (Signed)
Patient Advocate Encounter   Received notification from [employer] that prior authorization for Opsumit is required.   PA submitted on CoverMyMeds Key  [id number] Status is pending   Will continue to follow.

## 2021-10-02 NOTE — Progress Notes (Signed)
ADVANCED HF CLINIC NOTE  Referring Provider: Arnette Felts, PA-C Pulmonary: Dr. Gerome Apley HF Cardiologist: Dr. Gala Romney  HPI:  Bradley English is a 55 y.o.male with HTN, morbid obesity, COPD, severe OSA, DM2, former tobacco use referred by Bradley Felts PA-C for further evaluation of pulmonary HTN   Smoked 1.5 ppd x 30 years quit 1/21   Has worked in Landscape architect with re-upholstering. In 10/20 started to notice more LE swelling and SOB. Went to ER at [facility]. Had chest CT (negative for PE) and LE u/s negative.   Echo 7/21: LVEF 55-60% RV severely dilated with moderately decreased function. Flattened septum   I saw him for the first time in 7/21 with Class IIIB symptoms and underwent R/L cath. 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 Sildenafil started and felt like it really helped.   Echo 10/25/20: LVEF 65% RV markedly dilated and hypokinetic D-shaped septum.   Macitentan added 1/22  RHC 5/22  RA = 11 RV = 101/14 PA = 100/34 (58) PCW = 17 Fick cardiac output/index = 9.0/3.5 PVR = 4.6 WU Ao sat = 93% PA sat = 69%, 70%  -> selexipag added  Today he returns for follow up. Overall feeling fine. Tolerating selexipeg 200 bid well, 400 bid caused severe leg pain. No SOB with walking on flat ground, some SOB with lifting but otherwise OK.. Denies palpitations, CP, dizziness, edema, or PND/Orthopnea. Appetite ok. No fever or chills. Weight at home 318 pounds. Taking all medications. Not really working out anymore. Still working FT as Patent attorney. Wearing CPAP.  Echo today 10/03/21: EF 60-65% RV dilated severely HK. Septum flat. RVSP ~ 80 Personally reviewed  Studies: 1/22 243 m (800 feet)  9/22: 426 m (1400 feet)  Cath 7/21 1. Normal coronary arteries with left dominant system and separate LAD and LCX ostia 2. LVEF 65% 3. Severe PAH  Ao =95/67 (80) LV = 100/12 RA = 17 RV = 88/23 PA = 90/39 (58) PCW = 8 Fick cardiac output/index = 4.5/1.7 Thermo  CO/CI = 6.0/2.3 PVR = 8.2 WU FA sat = 97% PA sat = 59%, 61%  PFTs 11/20 FEV1 1.93 (57% FVC 2.66 (64%) FEF 25-75% 35% DLCO 63%  Current Outpatient Medications  Medication Sig Dispense Refill   aspirin EC 81 MG tablet Take 81 mg by mouth in the morning. Swallow whole.     atorvastatin (LIPITOR) 10 MG tablet Take 10 mg by mouth at bedtime.      carvedilol (COREG) 12.5 MG tablet Take 12.5 mg by mouth 2 (two) times daily.     furosemide (LASIX) 40 MG tablet Take 1 tablet (40 mg total) by mouth daily. 30 tablet 3   ibuprofen (ADVIL) 200 MG tablet Take 800 mg by mouth every 8 (eight) hours as needed (headache).     macitentan (OPSUMIT) 10 MG tablet Take 1 tablet (10 mg total) by mouth daily. 30 tablet 5   metFORMIN (GLUCOPHAGE) 500 MG tablet Take 500 mg by mouth 2 (two) times daily with a meal.     Selexipag (UPTRAVI) 200 & 800 MCG TBPK Start 200 mcg Twice daily, up titrate per pharmacy 200 each 0   sildenafil (REVATIO) 20 MG tablet Take 2 tablets (40 mg total) by mouth 3 (three) times daily. 180 tablet 11   spironolactone (ALDACTONE) 25 MG tablet Take 1 tablet (25 mg total) by mouth daily. 90 tablet 3   testosterone cypionate (DEPOTESTOSTERONE CYPIONATE) 200 MG/ML injection Patient does injection  once every 2 weeks.     Vitamin D, Ergocalciferol, (DRISDOL) 1.25 MG (50000 UNIT) CAPS capsule Take 50,000 Units by mouth once a week.     No current facility-administered medications for this encounter.   Allergies  Allergen Reactions   Empagliflozin Rash    Jardiance   Social History   Socioeconomic History   Marital status: Married    Spouse name: Not on file   Number of children: Not on file   Years of education: Not on file   Highest education level: Not on file  Occupational History   Not on file  Tobacco Use   Smoking status: Former   Smokeless tobacco: Former  Building services engineer Use: Never used  Substance and Sexual Activity   Alcohol use: Not Currently   Drug use: Not on  file   Sexual activity: Not on file  Other Topics Concern   Not on file  Social History Narrative   Not on file   Social Determinants of Health   Financial Resource Strain: Not on file  Food Insecurity: Not on file  Transportation Needs: Not on file  Physical Activity: Not on file  Stress: Not on file  Social Connections: Not on file  Intimate Partner Violence: Not on file   FHx: M died to cancer D died due to MVA PGF  Died to HF Brother stage IV kidney CA Sister died due to ETOH cirrhosis  BP 116/68    Pulse 61    Wt (!) 144.4 kg (318 lb 6.4 oz)    SpO2 95%    BMI 47.71 kg/m   Wt Readings from Last 3 Encounters:  10/03/21 (!) 144.4 kg (318 lb 6.4 oz)  06/26/21 (!) 144.8 kg (319 lb 3.2 oz)  03/03/21 (!) 153.3 kg (338 lb)   PHYSICAL EXAM: General:  NAD. No resp difficulty HEENT: Normal Neck: Supple. Thick neck, JVP difficult. Carotids 2+ bilat; no bruits. No lymphadenopathy or thryomegaly appreciated. Cor: PMI nondisplaced. Regular rate & rhythm. 2/6 TR Lungs: Clear Abdomen: Obese, nontender, nondistended. No hepatosplenomegaly. No bruits or masses. Good bowel sounds. Extremities: No cyanosis, clubbing, rash, edema; bilateral varicose veins Neuro: Alert & oriented x 3, cranial nerves grossly intact. Moves all 4 extremities w/o difficulty. Affect pleasant.  ASSESSMENT & PLAN:  1. Pulmonary HTN/cor pulmonale - suspect mostly WHO Group III but may  Have component of WHO Group 2 +/- anorexigen meds (Phenteramine). - Suspect WHO group I & III. - CT chest and LE dopplers negative for DVT/PE - Has severe OSA -> continue CPAP. - PFTs reviewed and reflect moderate restriction/obstruction. - RHC 7/21 with severe PAH. PA 90/39 (58) PCWP 8 PVR 8.2 WU.  - Echo 10/25/20: EF 60-65%, RV markedly dilated and HK RV flattened septum, markedly dilated - Repeat RHC 5/22: PA = 100/34 (58) PCW = 17 Fick cardiac output/index = 9.0/3.5 PVR = 4.6 WU - 1/22 226m (800 feet)  - 9/22 426  m (1400 ft) - Echo today 10/03/21: EF 60-65% RV dilated severely HK. Septum flat. RVSP ~ 80 Personally reviewed - Functionally Much improved II. Volume status ok. - Now on sildenafil 40 tid, macitentan 10 mg daily and selex 200 bid  - Consider pushing selex to 200/400 if tolerated. - Functionally much improved with triple therapy but echo still with significant PH and RV strain  - Reveal Risk score low risk (4)  - Given persistent RV strain on echo. Will repeat RHC to further assess. May  need to consider IV therapies at some point - Also needs VQ for completeness sake  2. Morbid obesity - Has lost 20 pounds. Encouraged to continue. - Set a goal for him for under 300 pounds. - He has been on Ozempic in the past, unclear why this was stopped.  3. HTN - BP looks good.   4. Severe OSA - Compliant with CPAP.  [name], Bradley English  11:09 AM

## 2021-10-02 NOTE — H&P (View-Only) (Signed)
° °ADVANCED HF CLINIC NOTE ° °Referring Provider: Mike Duran, PA-C °Pulmonary: Dr. Buford °HF Cardiologist: Dr. Reagen Goates ° °HPI: ° °Bradley English is a 55 y.o.male with HTN, morbid obesity, COPD, severe OSA, DM2, former tobacco use referred by Mike Duran PA-C for further evaluation of pulmonary HTN  ° °Smoked 1.5 ppd x 30 years quit 1/21  ° °Has worked in furniture industry with re-upholstering. In 10/20 started to notice more LE swelling and SOB. Went to ER at [facility]. Had chest CT (negative for PE) and LE u/s negative.  ° °Echo 7/21: LVEF 55-60% RV severely dilated with moderately decreased function. Flattened septum  ° °I saw him for the first time in 7/21 with Class IIIB symptoms and underwent R/L cath. 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 Sildenafil started and felt like it really helped.  ° °Echo 10/25/20: LVEF 65% RV markedly dilated and hypokinetic D-shaped septum.  ° °Macitentan added 1/22 ° °RHC 5/22  °RA = 11 °RV = 101/14 °PA = 100/34 (58) °PCW = 17 °Fick cardiac output/index = 9.0/3.5 °PVR = 4.6 WU °Ao sat = 93% °PA sat = 69%, 70% ° °-> selexipag added ° °Today he returns for follow up. Overall feeling fine. Tolerating selexipeg 200 bid well, 400 bid caused severe leg pain. No SOB with walking on flat ground, some SOB with lifting but otherwise OK.. Denies palpitations, CP, dizziness, edema, or PND/Orthopnea. Appetite ok. No fever or chills. Weight at home 318 pounds. Taking all medications. Not really working out anymore. Still working FT as upholestry supervisor. Wearing CPAP. ° °Echo today 10/03/21: EF 60-65% RV dilated severely HK. Septum flat. RVSP ~ 80 Personally reviewed ° °Studies: °6MW 1/22 243 m (800 feet)  °6MW 9/22: 426 m (1400 feet) ° °Cath 7/21 °1. Normal coronary arteries with left dominant system and separate LAD and LCX ostia °2. LVEF 65% °3. Severe PAH ° °Ao =95/67 (80) °LV = 100/12 °RA = 17 °RV = 88/23 °PA = 90/39 (58) °PCW = 8 °Fick cardiac output/index = 4.5/1.7 °Thermo  CO/CI = 6.0/2.3 °PVR = 8.2 WU °FA sat = 97% °PA sat = 59%, 61% ° °PFTs 11/20 °FEV1 1.93 (57% °FVC 2.66 (64%) °FEF 25-75% 35% °DLCO 63% ° °Current Outpatient Medications  °Medication Sig Dispense Refill  ° aspirin EC 81 MG tablet Take 81 mg by mouth in the morning. Swallow whole.    ° atorvastatin (LIPITOR) 10 MG tablet Take 10 mg by mouth at bedtime.     ° carvedilol (COREG) 12.5 MG tablet Take 12.5 mg by mouth 2 (two) times daily.    ° furosemide (LASIX) 40 MG tablet Take 1 tablet (40 mg total) by mouth daily. 30 tablet 3  ° ibuprofen (ADVIL) 200 MG tablet Take 800 mg by mouth every 8 (eight) hours as needed (headache).    ° macitentan (OPSUMIT) 10 MG tablet Take 1 tablet (10 mg total) by mouth daily. 30 tablet 5  ° metFORMIN (GLUCOPHAGE) 500 MG tablet Take 500 mg by mouth 2 (two) times daily with a meal.    ° Selexipag (UPTRAVI) 200 & 800 MCG TBPK Start 200 mcg Twice daily, up titrate per pharmacy 200 each 0  ° sildenafil (REVATIO) 20 MG tablet Take 2 tablets (40 mg total) by mouth 3 (three) times daily. 180 tablet 11  ° spironolactone (ALDACTONE) 25 MG tablet Take 1 tablet (25 mg total) by mouth daily. 90 tablet 3  ° testosterone cypionate (DEPOTESTOSTERONE CYPIONATE) 200 MG/ML injection Patient does injection   once every 2 weeks.    ° Vitamin D, Ergocalciferol, (DRISDOL) 1.25 MG (50000 UNIT) CAPS capsule Take 50,000 Units by mouth once a week.    ° °No current facility-administered medications for this encounter.  ° °Allergies  °Allergen Reactions  ° Empagliflozin Rash  °  Jardiance  ° °Social History  ° °Socioeconomic History  ° Marital status: Married  °  Spouse name: Not on file  ° Number of children: Not on file  ° Years of education: Not on file  ° Highest education level: Not on file  °Occupational History  ° Not on file  °Tobacco Use  ° Smoking status: Former  ° Smokeless tobacco: Former  °Vaping Use  ° Vaping Use: Never used  °Substance and Sexual Activity  ° Alcohol use: Not Currently  ° Drug use: Not on  file  ° Sexual activity: Not on file  °Other Topics Concern  ° Not on file  °Social History Narrative  ° Not on file  ° °Social Determinants of Health  ° °Financial Resource Strain: Not on file  °Food Insecurity: Not on file  °Transportation Needs: Not on file  °Physical Activity: Not on file  °Stress: Not on file  °Social Connections: Not on file  °Intimate Partner Violence: Not on file  ° °FHx: °M died to cancer °D died due to MVA °PGF  Died to HF °Brother stage IV kidney CA °Sister died due to ETOH cirrhosis ° °BP 116/68    Pulse 61    Wt (!) 144.4 kg (318 lb 6.4 oz)    SpO2 95%    BMI 47.71 kg/m²  ° °Wt Readings from Last 3 Encounters:  °10/03/21 (!) 144.4 kg (318 lb 6.4 oz)  °06/26/21 (!) 144.8 kg (319 lb 3.2 oz)  °03/03/21 (!) 153.3 kg (338 lb)  ° °PHYSICAL EXAM: °General:  NAD. No resp difficulty °HEENT: Normal °Neck: Supple. Thick neck, JVP difficult. Carotids 2+ bilat; no bruits. No lymphadenopathy or thryomegaly appreciated. °Cor: PMI nondisplaced. Regular rate & rhythm. 2/6 TR °Lungs: Clear °Abdomen: Obese, nontender, nondistended. No hepatosplenomegaly. No bruits or masses. Good bowel sounds. °Extremities: No cyanosis, clubbing, rash, edema; bilateral varicose veins °Neuro: Alert & oriented x 3, cranial nerves grossly intact. Moves all 4 extremities w/o difficulty. Affect pleasant. ° °ASSESSMENT & PLAN: ° °1. Pulmonary HTN/cor pulmonale °- suspect mostly WHO Group III but may  Have component of WHO Group 2 +/- anorexigen meds (Phenteramine). °- Suspect WHO group I & III. °- CT chest and LE dopplers negative for DVT/PE °- Has severe OSA -> continue CPAP. °- PFTs reviewed and reflect moderate restriction/obstruction. °- RHC 7/21 with severe PAH. PA 90/39 (58) PCWP 8 PVR 8.2 WU.  °- Echo 10/25/20: EF 60-65%, RV markedly dilated and HK RV flattened septum, markedly dilated °- Repeat RHC 5/22: PA = 100/34 (58) PCW = 17 Fick cardiac output/index = 9.0/3.5 PVR = 4.6 WU °- 6MW 1/22 243m (800 feet)  °- 6MW 9/22 426  m (1400 ft) °- Echo today 10/03/21: EF 60-65% RV dilated severely HK. Septum flat. RVSP ~ 80 Personally reviewed °- Functionally Much improved II. Volume status ok. °- Now on sildenafil 40 tid, macitentan 10 mg daily and selex 200 bid  °- Consider pushing selex to 200/400 if tolerated. °- Functionally much improved with triple therapy but echo still with significant PH and RV strain  °- Reveal Risk score low risk (4)  °- Given persistent RV strain on echo. Will repeat RHC to further assess. May   need to consider IV therapies at some point - Also needs VQ for completeness sake  2. Morbid obesity - Has lost 20 pounds. Encouraged to continue. - Set a goal for him for under 300 pounds. - He has been on Ozempic in the past, unclear why this was stopped.  3. HTN - BP looks good.   4. Severe OSA - Compliant with CPAP.  [name], MD  11:09 AM

## 2021-10-03 ENCOUNTER — Ambulatory Visit (HOSPITAL_COMMUNITY)
Admission: RE | Admit: 2021-10-03 | Discharge: 2021-10-03 | Disposition: A | Discharge status: Home / Self Care | Payer: 59 | Source: Ambulatory Visit | Attending: Internal Medicine | Admitting: Internal Medicine

## 2021-10-03 ENCOUNTER — Encounter (HOSPITAL_COMMUNITY): Payer: Self-pay | Admitting: Internal Medicine

## 2021-10-03 ENCOUNTER — Ambulatory Visit (HOSPITAL_BASED_OUTPATIENT_CLINIC_OR_DEPARTMENT_OTHER)
Admission: RE | Admit: 2021-10-03 | Discharge: 2021-10-03 | Disposition: A | Discharge status: Home / Self Care | Payer: 59 | Source: Ambulatory Visit | Attending: Internal Medicine | Admitting: Internal Medicine

## 2021-10-03 ENCOUNTER — Other Ambulatory Visit (HOSPITAL_COMMUNITY): Payer: Self-pay

## 2021-10-03 ENCOUNTER — Other Ambulatory Visit: Payer: Self-pay

## 2021-10-03 DIAGNOSIS — R0602 Shortness of breath: Secondary | ICD-10-CM | POA: Diagnosis present

## 2021-10-03 DIAGNOSIS — G4733 Obstructive sleep apnea (adult) (pediatric): Secondary | ICD-10-CM | POA: Diagnosis not present

## 2021-10-03 DIAGNOSIS — J449 Chronic obstructive pulmonary disease, unspecified: Secondary | ICD-10-CM | POA: Insufficient documentation

## 2021-10-03 DIAGNOSIS — E119 Type 2 diabetes mellitus without complications: Secondary | ICD-10-CM | POA: Diagnosis not present

## 2021-10-03 DIAGNOSIS — Z09 Encounter for follow-up examination after completed treatment for conditions other than malignant neoplasm: Secondary | ICD-10-CM | POA: Insufficient documentation

## 2021-10-03 DIAGNOSIS — Z6841 Body Mass Index (BMI) 40.0 and over, adult: Secondary | ICD-10-CM | POA: Diagnosis not present

## 2021-10-03 DIAGNOSIS — Z87891 Personal history of nicotine dependence: Secondary | ICD-10-CM | POA: Diagnosis not present

## 2021-10-03 DIAGNOSIS — I119 Hypertensive heart disease without heart failure: Secondary | ICD-10-CM | POA: Diagnosis not present

## 2021-10-03 DIAGNOSIS — I2781 Cor pulmonale (chronic): Secondary | ICD-10-CM | POA: Diagnosis not present

## 2021-10-03 DIAGNOSIS — I5032 Chronic diastolic (congestive) heart failure: Secondary | ICD-10-CM

## 2021-10-03 DIAGNOSIS — I272 Pulmonary hypertension, unspecified: Secondary | ICD-10-CM

## 2021-10-03 DIAGNOSIS — I2729 Other secondary pulmonary hypertension: Secondary | ICD-10-CM | POA: Diagnosis not present

## 2021-10-03 DIAGNOSIS — Z79899 Other long term (current) drug therapy: Secondary | ICD-10-CM | POA: Insufficient documentation

## 2021-10-03 LAB — ECHOCARDIOGRAM COMPLETE
AR max vel: 2.65 cm2
AV Area VTI: 2.68 cm2
AV Area mean vel: 2.55 cm2
AV Mean grad: 4 mmHg
AV Peak grad: 7.2 mmHg
Ao pk vel: 1.34 m/s
Area-P 1/2: 2.55 cm2
MV VTI: 1.94 cm2
S' Lateral: 2.7 cm

## 2021-10-03 LAB — BASIC METABOLIC PANEL
Anion gap: 6 (ref 5–15)
BUN: 33 mg/dL — ABNORMAL HIGH (ref 6–20)
CO2: 23 mmol/L (ref 22–32)
Calcium: 9 mg/dL (ref 8.9–10.3)
Chloride: 104 mmol/L (ref 98–111)
Creatinine, Ser: 1.24 mg/dL (ref 0.61–1.24)
GFR, Estimated: >60 mL/min (ref >60)
Glucose, Bld: 121 mg/dL — ABNORMAL HIGH (ref 70–99)
Potassium: 4.7 mmol/L (ref 3.5–5.1)
Sodium: 133 mmol/L — ABNORMAL LOW (ref 135–145)

## 2021-10-03 LAB — CBC
HCT: 41.9 % (ref 39.0–52.0)
Hemoglobin: 13.2 g/dL (ref 13.0–17.0)
MCH: 27.4 pg (ref 26.0–34.0)
MCHC: 31.5 g/dL (ref 30.0–36.0)
MCV: 86.9 fL (ref 80.0–100.0)
Platelets: 219 10*3/uL (ref 150–400)
RBC: 4.82 MIL/uL (ref 4.22–5.81)
RDW: 14.6 % (ref 11.5–15.5)
WBC: 10.4 10*3/uL (ref 4.0–10.5)
nRBC: 0 % (ref 0.0–0.2)

## 2021-10-03 LAB — BRAIN NATRIURETIC PEPTIDE: B Natriuretic Peptide: 327.7 pg/mL — ABNORMAL HIGH (ref 0.0–100.0)

## 2021-10-03 MED ORDER — SODIUM CHLORIDE 0.9% FLUSH: 3.0000 mL | Freq: Two times a day (BID) | INTRAVENOUS | Status: DC

## 2021-10-03 NOTE — Progress Notes (Signed)
°  Echocardiogram 2D Echocardiogram has been performed.  Bradley English 10/03/2021, 9:56 AM

## 2021-10-03 NOTE — Addendum Note (Signed)
Encounter addended by: Sharol Roussel, RN on: 10/03/2021 11:34 AM  Actions taken: Clinical Note Signed

## 2021-10-03 NOTE — Addendum Note (Signed)
Encounter addended by: Sharol Roussel, RN on: 10/03/2021 11:32 AM  Actions taken: Visit diagnoses modified, Order list changed, Diagnosis association updated, Pend clinical note, Charge Capture section accepted, Clinical Note Signed

## 2021-10-03 NOTE — Patient Instructions (Addendum)
No medication changes today!  Labs today We will only contact you if something comes back abnormal or we need to make some changes. Otherwise no news is good news!  You were ordered for a VQ Scan.  This test takes images of your lungs.  You will be called to schedule an appointment.   Your physician recommends that you schedule a follow-up appointment in: 4 months. Please call  in February to schedule your next follow up   [facility] [address] [id number] [facility] [place] [address] Dept: [phone] Loc: [phone]  Bradley English  10/03/2021  You are scheduled for a Cardiac Catheterization on Tuesday, December 20 with Dr. [name].  1. Please arrive at the [facility] (Main Entrance A) at [facility]: [address] [place], [place] [address] at 5:30 AM (This time is two hours before your procedure to ensure your preparation). Free valet parking service is available.   Special note: Every effort is made to have your procedure done on time. Please understand that emergencies sometimes delay scheduled procedures.  2. Diet: Nothing to eat or drink the after midnight  3. Labs: drawn today in office  4. Medication instructions in preparation for your procedure:   Contrast Allergy: No   HOLD ALL medications on the morning of your procedure except for Aspirin.  Do not take Diabetes Med Glucophage (Metformin) on the day of the procedure and HOLD 48 HOURS AFTER THE PROCEDURE.  On the morning of your procedure, take your Aspirin and any morning medicines NOT listed above.  You may use sips of water.  5. Plan for one night stay--bring personal belongings. 6. Bring a current list of your medications and current insurance cards. 7. You MUST have a responsible person to drive you home. 8. Someone MUST be with you the first 24 hours after you arrive home or your discharge will be  delayed. 9. Please wear clothes that are easy to get on and off and wear slip-on shoes.  Thank you for allowing [place] to care for you!   -- [facility]

## 2021-10-06 ENCOUNTER — Telehealth (HOSPITAL_COMMUNITY): Payer: Self-pay | Admitting: *Deleted

## 2021-10-06 NOTE — Telephone Encounter (Signed)
° °  VQ scan in clinical review

## 2021-10-07 ENCOUNTER — Encounter (HOSPITAL_COMMUNITY)
Admission: RE | Disposition: A | Discharge status: Home / Self Care | Payer: Self-pay | Source: Home / Self Care | Attending: Internal Medicine

## 2021-10-07 ENCOUNTER — Ambulatory Visit (HOSPITAL_COMMUNITY)
Admission: RE | Admit: 2021-10-07 | Discharge: 2021-10-07 | Disposition: A | Discharge status: Home / Self Care | Payer: 59 | Attending: Internal Medicine | Admitting: Internal Medicine

## 2021-10-07 ENCOUNTER — Encounter (HOSPITAL_COMMUNITY): Payer: Self-pay | Admitting: Internal Medicine

## 2021-10-07 ENCOUNTER — Other Ambulatory Visit: Payer: Self-pay

## 2021-10-07 DIAGNOSIS — I5032 Chronic diastolic (congestive) heart failure: Secondary | ICD-10-CM

## 2021-10-07 DIAGNOSIS — G4733 Obstructive sleep apnea (adult) (pediatric): Secondary | ICD-10-CM | POA: Diagnosis not present

## 2021-10-07 DIAGNOSIS — E119 Type 2 diabetes mellitus without complications: Secondary | ICD-10-CM | POA: Insufficient documentation

## 2021-10-07 DIAGNOSIS — I10 Essential (primary) hypertension: Secondary | ICD-10-CM | POA: Diagnosis not present

## 2021-10-07 DIAGNOSIS — Z79899 Other long term (current) drug therapy: Secondary | ICD-10-CM | POA: Diagnosis not present

## 2021-10-07 DIAGNOSIS — I2721 Secondary pulmonary arterial hypertension: Secondary | ICD-10-CM | POA: Diagnosis not present

## 2021-10-07 DIAGNOSIS — J449 Chronic obstructive pulmonary disease, unspecified: Secondary | ICD-10-CM | POA: Diagnosis not present

## 2021-10-07 DIAGNOSIS — Z6841 Body Mass Index (BMI) 40.0 and over, adult: Secondary | ICD-10-CM | POA: Insufficient documentation

## 2021-10-07 DIAGNOSIS — Z87891 Personal history of nicotine dependence: Secondary | ICD-10-CM | POA: Diagnosis not present

## 2021-10-07 HISTORY — PX: RIGHT HEART CATH: CATH118263

## 2021-10-07 LAB — POCT I-STAT EG7
Acid-base deficit: 2 mmol/L (ref 0.0–2.0)
Acid-base deficit: 3 mmol/L — ABNORMAL HIGH (ref 0.0–2.0)
Acid-base deficit: 4 mmol/L — ABNORMAL HIGH (ref 0.0–2.0)
Bicarbonate: 21 mmol/L (ref 20.0–28.0)
Bicarbonate: 22.8 mmol/L (ref 20.0–28.0)
Bicarbonate: 22.9 mmol/L (ref 20.0–28.0)
Calcium, Ion: 1.08 mmol/L — ABNORMAL LOW (ref 1.15–1.40)
Calcium, Ion: 1.24 mmol/L (ref 1.15–1.40)
Calcium, Ion: 1.25 mmol/L (ref 1.15–1.40)
HCT: 38 % — ABNORMAL LOW (ref 39.0–52.0)
HCT: 41 % (ref 39.0–52.0)
HCT: 41 % (ref 39.0–52.0)
Hemoglobin: 12.9 g/dL — ABNORMAL LOW (ref 13.0–17.0)
Hemoglobin: 13.9 g/dL (ref 13.0–17.0)
Hemoglobin: 13.9 g/dL (ref 13.0–17.0)
O2 Saturation: 67 %
O2 Saturation: 70 %
O2 Saturation: 72 %
Potassium: 4.2 mmol/L (ref 3.5–5.1)
Potassium: 4.8 mmol/L (ref 3.5–5.1)
Potassium: 4.8 mmol/L (ref 3.5–5.1)
Sodium: 139 mmol/L (ref 135–145)
Sodium: 139 mmol/L (ref 135–145)
Sodium: 142 mmol/L (ref 135–145)
TCO2: 22 mmol/L (ref 22–32)
TCO2: 24 mmol/L (ref 22–32)
TCO2: 24 mmol/L (ref 22–32)
pCO2, Ven: 36.3 mmHg — ABNORMAL LOW (ref 44.0–60.0)
pCO2, Ven: 39.5 mmHg — ABNORMAL LOW (ref 44.0–60.0)
pCO2, Ven: 40.5 mmHg — ABNORMAL LOW (ref 44.0–60.0)
pH, Ven: 7.358 (ref 7.250–7.430)
pH, Ven: 7.371 (ref 7.250–7.430)
pH, Ven: 7.371 (ref 7.250–7.430)
pO2, Ven: 35 mmHg (ref 32.0–45.0)
pO2, Ven: 38 mmHg (ref 32.0–45.0)
pO2, Ven: 38 mmHg (ref 32.0–45.0)

## 2021-10-07 LAB — GLUCOSE, CAPILLARY: Glucose-Capillary: 126 mg/dL — ABNORMAL HIGH (ref 70–99)

## 2021-10-07 SURGERY — RIGHT HEART CATH
Anesthesia: LOCAL

## 2021-10-07 MED ORDER — LIDOCAINE HCL (PF) 1 % IJ SOLN
INTRAMUSCULAR | Status: DC | PRN
  Administered 2021-10-07: 2 mL

## 2021-10-07 MED ORDER — SODIUM CHLORIDE 0.9 % IV SOLN: 250.0000 mL | INTRAVENOUS | Status: DC | PRN

## 2021-10-07 MED ORDER — SODIUM CHLORIDE 0.9 % IV SOLN: INTRAVENOUS | Status: DC

## 2021-10-07 MED ORDER — SODIUM CHLORIDE 0.9% FLUSH: 3.0000 mL | INTRAVENOUS | Status: DC | PRN

## 2021-10-07 MED ORDER — LABETALOL HCL 5 MG/ML IV SOLN: 10.0000 mg | INTRAVENOUS | Status: DC | PRN

## 2021-10-07 MED ORDER — ACETAMINOPHEN 325 MG PO TABS: 650.0000 mg | ORAL_TABLET | ORAL | Status: DC | PRN

## 2021-10-07 MED ORDER — HEPARIN (PORCINE) IN NACL 1000-0.9 UT/500ML-% IV SOLN
INTRAVENOUS | Status: DC | PRN
  Administered 2021-10-07: 500 mL

## 2021-10-07 MED ORDER — SODIUM CHLORIDE 0.9% FLUSH: 3.0000 mL | Freq: Two times a day (BID) | INTRAVENOUS | Status: DC

## 2021-10-07 MED ORDER — LIDOCAINE HCL (PF) 1 % IJ SOLN
INTRAMUSCULAR | Status: AC
  Filled 2021-10-07: qty 30

## 2021-10-07 MED ORDER — ONDANSETRON HCL 4 MG/2ML IJ SOLN: 4.0000 mg | Freq: Four times a day (QID) | INTRAMUSCULAR | Status: DC | PRN

## 2021-10-07 MED ORDER — HEPARIN (PORCINE) IN NACL 1000-0.9 UT/500ML-% IV SOLN
INTRAVENOUS | Status: AC
  Filled 2021-10-07: qty 500

## 2021-10-07 MED ORDER — HYDRALAZINE HCL 20 MG/ML IJ SOLN: 10.0000 mg | INTRAMUSCULAR | Status: DC | PRN

## 2021-10-07 SURGICAL SUPPLY — 4 items
CATH BALLN WEDGE 5F 110CM (CATHETERS) ×2 IMPLANT
PACK CARDIAC CATHETERIZATION (CUSTOM PROCEDURE TRAY) ×3 IMPLANT
SHEATH GLIDE SLENDER 4/5FR (SHEATH) ×2 IMPLANT
TRANSDUCER W/STOPCOCK (MISCELLANEOUS) ×3 IMPLANT

## 2021-10-07 NOTE — Interval H&P Note (Signed)
History and Physical Interval Note:  10/07/2021 7:50 AM  Bradley English  has presented today for surgery, with the diagnosis of PAH.  The various methods of treatment have been discussed with the patient and family. After consideration of risks, benefits and other options for treatment, the patient has consented to  Procedure(s): RIGHT HEART CATH (N/A) as a surgical intervention.  The patient's history has been reviewed, patient examined, no change in status, stable for surgery.  I have reviewed the patient's chart and labs.  Questions were answered to the patient's satisfaction.     Christobal Morado

## 2021-10-10 NOTE — Telephone Encounter (Signed)
Advanced Heart Failure Patient Advocate Encounter  Prior Authorization for Opsumit has been approved.    PA# [id number] Effective dates: 09/29/21 through 09/29/22  [name], CPhT

## 2021-11-07 ENCOUNTER — Other Ambulatory Visit (HOSPITAL_COMMUNITY): Payer: Self-pay

## 2021-11-07 MED ORDER — MACITENTAN 10 MG PO TABS: 10.0000 mg | ORAL_TABLET | Freq: Every day | ORAL | 3 refills | Status: DC

## 2021-11-21 ENCOUNTER — Other Ambulatory Visit (HOSPITAL_COMMUNITY): Payer: Self-pay | Admitting: Internal Medicine

## 2021-11-24 ENCOUNTER — Other Ambulatory Visit (HOSPITAL_COMMUNITY): Payer: Self-pay | Admitting: *Deleted

## 2021-11-24 MED ORDER — MACITENTAN 10 MG PO TABS: 10.0000 mg | ORAL_TABLET | Freq: Every day | ORAL | 3 refills | Status: DC

## 2021-12-16 ENCOUNTER — Other Ambulatory Visit (HOSPITAL_COMMUNITY): Payer: Self-pay | Admitting: Internal Medicine

## 2022-01-07 ENCOUNTER — Other Ambulatory Visit (HOSPITAL_COMMUNITY): Payer: Self-pay | Admitting: Internal Medicine

## 2022-02-05 NOTE — Progress Notes (Signed)
? ?ADVANCED HF CLINIC NOTE ?PCP: Bradley Ruduran, Michael R, PA-C ?Pulmonary: Dr. Gerome ApleyBuford ?HF Cardiologist: Dr. Gala RomneyBensimhon ? ?HPI: ?[name] is a 56 y.o.male with HTN, morbid obesity, COPD, severe OSA, DM2, former tobacco use referred by Bradley FeltsMike Duran PA-C for further evaluation of pulmonary HTN  ? ?Smoked 1.5 ppd x 30 years quit 1/21  ? ?Has worked in Landscape architectfurniture industry with re-upholstering. In 10/20 started to notice more LE swelling and SOB. Went to ER at [facility]. Had chest CT (negative for PE) and LE u/s negative.  ? ?Echo 7/21: LVEF 55-60% RV severely dilated with moderately decreased function. Flattened septum  ? ?Dr. Gala RomneyBensimhon saw him for the first time in 7/21 with Class IIIB symptoms and underwent English/L cath. 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 Sildenafil started and felt like it really helped.  ? ?Echo 10/25/20: LVEF 65% RV markedly dilated and hypokinetic D-shaped septum.  ? ?Macitentan added 1/22. ? ?RHC 5/22  ?RA = 11 ?RV = 101/14 ?PA = 100/34 (58) ?PCW = 17 ?Fick cardiac output/index = 9.0/3.5 ?PVR = 4.6 WU ?Ao sat = 93% ?PA sat = 69%, 70% ? ?-> selexipag added. ? ?Follow up 12/22, NYHA II, volume ok. Tolerating selexipeg 200 bid well, 400 bid caused severe leg pain.  ? ?Echo 10/03/21: EF 60-65% RV dilated severely HK. Septum flat. RVSP ~ 80  ? ?RHC 12/22 showed severe PAH despite triple therapy. Sildenafil increased to 60 tid. Consider switch to Adempas.   ? ?RA = 8 ?RV = 170/18 ?PA = 104/32 (56) ?PCW = 6 ?Fick cardiac output/index = 6.3/2.6 ?PVR = 7.9 WU ?PAPi = 9.0 ?Ao sat = 97% ?PA sat = 67%, 72% ?SVC sat = 70% ? ?Today he returns for HF follow up. Overall feeling fine. SOB walking up hills but no issues at work. Occasional dizziness if he stands too quickly. Denies palpitations, CP, edema, or PND/Orthopnea. Appetite ok. No fever or chills. Weight at home 318 pounds. Has been on sildenafil 40 tid for the past month, ran out of refills for 60 tid. Still working FT as Patent attorneyupholestry supervisor. Wearing  CPAP. ? ?Cardiac Studies: ? ?- RHC (12/22): ?RA = 8 ?RV = 170/18 ?PA = 104/32 (56) ?PCW = 6 ?Fick cardiac output/index = 6.3/2.6 ?PVR = 7.9 WU ?PAPi = 9.0 ?Ao sat = 97% ?PA sat = 67%, 72% ?SVC sat = 70% ? ?- Echo (12/22): EF 60-65% RV dilated severely HK. Septum flat. RVSP ~ 80  ? ?- 6MW 1/22 243 m (800 feet)  ? ?- 6MW 9/22: 426 m (1400 feet) ? ?- Cath 7/21 ?1. Normal coronary arteries with left dominant system and separate LAD and LCX ostia ?2. LVEF 65% ?3. Severe PAH ? ?Ao =95/67 (80) ?LV = 100/12 ?RA = 17 ?RV = 88/23 ?PA = 90/39 (58) ?PCW = 8 ?Fick cardiac output/index = 4.5/1.7 ?Thermo CO/CI = 6.0/2.3 ?PVR = 8.2 WU ?FA sat = 97% ?PA sat = 59%, 61% ? ?- PFTs 11/20 ?FEV1 1.93 (57% ?FVC 2.66 (64%) ?FEF 25-75% 35% ?DLCO 63% ? ?Current Outpatient Medications  ?Medication Sig Dispense Refill  ? aspirin EC 81 MG tablet Take 81 mg by mouth in the morning. Swallow whole.    ? atorvastatin (LIPITOR) 10 MG tablet Take 10 mg by mouth at bedtime.     ? carvedilol (COREG) 12.5 MG tablet Take 12.5 mg by mouth 2 (two) times daily.    ? furosemide (LASIX) 40 MG tablet Take 1 tablet (40 mg total)  by mouth daily. 30 tablet 3  ? ibuprofen (ADVIL) 200 MG tablet Take 800 mg by mouth every 8 (eight) hours as needed (headache).    ? macitentan (OPSUMIT) 10 MG tablet Take 1 tablet (10 mg total) by mouth daily. 30 tablet 3  ? Selexipag (UPTRAVI) 200 & 800 MCG TBPK Start 200 mcg Twice daily, up titrate per pharmacy (Patient taking differently: Take 200 mcg by mouth 2 (two) times daily.) 200 each 0  ? sildenafil (REVATIO) 20 MG tablet TAKE 2 TABLETS BY MOUTH THREE TIMES DAILY 180 tablet 0  ? spironolactone (ALDACTONE) 25 MG tablet Take 1 tablet by mouth once daily 30 tablet 6  ? testosterone cypionate (DEPOTESTOSTERONE CYPIONATE) 200 MG/ML injection 200 mg every 14 (fourteen) days.    ? TRULICITY 1.5 0000000 SOPN Inject into the skin. Once weekly on Sundays    ? Vitamin D, Ergocalciferol, (DRISDOL) 1.25 MG (50000 UNIT) CAPS capsule Take  50,000 Units by mouth once a week. Monday    ? ?No current facility-administered medications for this encounter.  ? ?Allergies  ?Allergen Reactions  ? Jardiance [Empagliflozin] Rash  ? ?Social History  ? ?Socioeconomic History  ? Marital status: Married  ?  Spouse name: Not on file  ? Number of children: Not on file  ? Years of education: Not on file  ? Highest education level: Not on file  ?Occupational History  ? Not on file  ?Tobacco Use  ? Smoking status: Former  ? Smokeless tobacco: Former  ?Vaping Use  ? Vaping Use: Never used  ?Substance and Sexual Activity  ? Alcohol use: Not Currently  ? Drug use: Not on file  ? Sexual activity: Not on file  ?Other Topics Concern  ? Not on file  ?Social History Narrative  ? Not on file  ? ?Social Determinants of Health  ? ?Financial Resource Strain: Not on file  ?Food Insecurity: Not on file  ?Transportation Needs: Not on file  ?Physical Activity: Not on file  ?Stress: Not on file  ?Social Connections: Not on file  ?Intimate Partner Violence: Not on file  ? ?FHx: ?M died to cancer ?D died due to MVA ?PGF died to HF ?Brother stage IV kidney CA ?Sister died due to ETOH cirrhosis ? ?BP 130/70   Pulse 66   Wt (!) 146.9 kg (323 lb 12.8 oz)   SpO2 97%   BMI 49.23 kg/m?  ? ?Wt Readings from Last 3 Encounters:  ?02/09/22 (!) 146.9 kg (323 lb 12.8 oz)  ?10/07/21 (!) 144.2 kg (318 lb)  ?10/03/21 (!) 144.4 kg (318 lb 6.4 oz)  ? ?PHYSICAL EXAM: ?General:  NAD. No resp difficulty ?HEENT: Normal ?Neck: Supple. Thick neck. Carotids 2+ bilat; no bruits. No lymphadenopathy or thryomegaly appreciated. ?Cor: PMI nondisplaced. Regular rate & rhythm. No rubs, gallops, 2/6 TR ?Lungs: Clear ?Abdomen: Obese, soft, nontender, nondistended. No hepatosplenomegaly. No bruits or masses. Good bowel sounds. ?Extremities: No cyanosis, clubbing, rash, edema, + BLE varicosities. ?Neuro: Alert & oriented x 3, cranial nerves grossly intact. Moves all 4 extremities w/o difficulty. Affect  pleasant. ? ?ASSESSMENT & PLAN: ?1. Pulmonary HTN/cor pulmonale ?- suspect mostly WHO Group III but may  Have component of WHO Group 2 +/- anorexigen meds (Phenteramine). ?- Suspect WHO group I & III. ?- CT chest and LE dopplers negative for DVT/PE ?- Has severe OSA -> continue CPAP. ?- PFTs reviewed and reflect moderate restriction/obstruction. ?- RHC 7/21 with severe PAH. PA 90/39 (58) PCWP 8 PVR 8.2 WU.  ?-  Echo 10/25/20: EF 60-65%, RV markedly dilated and HK RV flattened septum, markedly dilated ?- Repeat RHC 5/22: PA = 100/34 (58) PCW = 17 Fick cardiac output/index = 9.0/3.5 PVR = 4.6 WU ?- 6MW 1/22 251m (800 feet)  ?- 6MW 9/22 426 m (1400 ft) ?- Echo 10/03/21: EF 60-65% RV dilated severely HK. Septum flat. RVSP ~ 80. ?- Functionally much improved with triple therapy, but echo still with significant PH and RV strain  ?- Repeat RHC 12/22 showed severe PAH despite triple therapy. Sildenafil increased to 60 tid.  ?- Functionally much improved II. Volume status ok. ?- Increase sildenafil back to 60 tid as previously ordered. Will make sure he has plenty of refills.  ?- Continue macitentan 10 mg daily and selex 200 bid (selex 400 bid caused severe leg pain). ?- Consider pushing selex to 200/400 if tolerated. ?- Consider switch to Adempas. May need to consider IV therapies at some point ?- Reveal Risk score low risk (4)  ?- Also needs VQ for completeness sake, this has been ordered. ?- Continue Lasix 40 mg daily. ?- Continue spiro 25 mg daily. ?- BMET and BNP today. ? ?2. Morbid obesity ?- Continue weight loss efforts ?- Set a goal for him for under 300 pounds. ?- He has been on Ozempic in the past, unclear why this was stopped. ?- Refer to PharmD for [place] (will message PCP as he is on Trulicity). ? ?3. HTN ?- BP ok.  ? ?4. Severe OSA ?- Compliant with CPAP. ? ?Follow up in 4 months with Dr. Haroldine Laws. ? ?Rafael Bihari, FNP  ?9:03 AM ?

## 2022-02-09 ENCOUNTER — Telehealth (HOSPITAL_COMMUNITY): Payer: Self-pay | Admitting: *Deleted

## 2022-02-09 ENCOUNTER — Encounter (HOSPITAL_COMMUNITY): Payer: Self-pay

## 2022-02-09 ENCOUNTER — Telehealth (HOSPITAL_COMMUNITY): Payer: Self-pay | Admitting: Pharmacy Technician

## 2022-02-09 ENCOUNTER — Ambulatory Visit (HOSPITAL_COMMUNITY)
Admission: RE | Admit: 2022-02-09 | Discharge: 2022-02-09 | Disposition: A | Discharge status: Home / Self Care | Payer: 59 | Source: Ambulatory Visit | Attending: Family Medicine | Admitting: Family Medicine

## 2022-02-09 ENCOUNTER — Other Ambulatory Visit (HOSPITAL_COMMUNITY): Payer: Self-pay

## 2022-02-09 VITALS — BP 130/70 | HR 66 | Wt 323.8 lb

## 2022-02-09 DIAGNOSIS — Z79899 Other long term (current) drug therapy: Secondary | ICD-10-CM | POA: Insufficient documentation

## 2022-02-09 DIAGNOSIS — J449 Chronic obstructive pulmonary disease, unspecified: Secondary | ICD-10-CM | POA: Diagnosis not present

## 2022-02-09 DIAGNOSIS — Z6841 Body Mass Index (BMI) 40.0 and over, adult: Secondary | ICD-10-CM | POA: Diagnosis not present

## 2022-02-09 DIAGNOSIS — R42 Dizziness and giddiness: Secondary | ICD-10-CM | POA: Insufficient documentation

## 2022-02-09 DIAGNOSIS — M79606 Pain in leg, unspecified: Secondary | ICD-10-CM | POA: Diagnosis not present

## 2022-02-09 DIAGNOSIS — I110 Hypertensive heart disease with heart failure: Secondary | ICD-10-CM | POA: Insufficient documentation

## 2022-02-09 DIAGNOSIS — I10 Essential (primary) hypertension: Secondary | ICD-10-CM | POA: Diagnosis not present

## 2022-02-09 DIAGNOSIS — R0602 Shortness of breath: Secondary | ICD-10-CM | POA: Diagnosis present

## 2022-02-09 DIAGNOSIS — E119 Type 2 diabetes mellitus without complications: Secondary | ICD-10-CM | POA: Insufficient documentation

## 2022-02-09 DIAGNOSIS — I5032 Chronic diastolic (congestive) heart failure: Secondary | ICD-10-CM

## 2022-02-09 DIAGNOSIS — I272 Pulmonary hypertension, unspecified: Secondary | ICD-10-CM | POA: Diagnosis not present

## 2022-02-09 DIAGNOSIS — G4733 Obstructive sleep apnea (adult) (pediatric): Secondary | ICD-10-CM | POA: Diagnosis not present

## 2022-02-09 DIAGNOSIS — Z9989 Dependence on other enabling machines and devices: Secondary | ICD-10-CM | POA: Diagnosis not present

## 2022-02-09 DIAGNOSIS — I2781 Cor pulmonale (chronic): Secondary | ICD-10-CM | POA: Insufficient documentation

## 2022-02-09 DIAGNOSIS — Z87891 Personal history of nicotine dependence: Secondary | ICD-10-CM | POA: Insufficient documentation

## 2022-02-09 LAB — BASIC METABOLIC PANEL WITH GFR
Anion gap: 6 (ref 5–15)
BUN: 31 mg/dL — ABNORMAL HIGH (ref 6–20)
CO2: 24 mmol/L (ref 22–32)
Calcium: 9.1 mg/dL (ref 8.9–10.3)
Chloride: 106 mmol/L (ref 98–111)
Creatinine, Ser: 1.34 mg/dL — ABNORMAL HIGH (ref 0.61–1.24)
GFR, Estimated: >60 mL/min (ref >60)
Glucose, Bld: 118 mg/dL — ABNORMAL HIGH (ref 70–99)
Potassium: 4.7 mmol/L (ref 3.5–5.1)
Sodium: 136 mmol/L (ref 135–145)

## 2022-02-09 LAB — BRAIN NATRIURETIC PEPTIDE: B Natriuretic Peptide: 339.6 pg/mL — ABNORMAL HIGH (ref 0.0–100.0)

## 2022-02-09 MED ORDER — SILDENAFIL CITRATE 20 MG PO TABS: 60.0000 mg | ORAL_TABLET | Freq: Three times a day (TID) | ORAL | 0 refills | Status: DC

## 2022-02-09 NOTE — Telephone Encounter (Signed)
Vq scan approved  ? ? ?

## 2022-02-09 NOTE — Patient Instructions (Addendum)
Thank you for coming in today ? ?Labs were done today, if any labs are abnormal the clinic will call you ?No news is good news  ? ?Your physician recommends that you schedule a follow-up appointment in:  ?4 months with Dr. Gala Romney ? ?INCREASE Sildenafil to 60 mg 3 tablets 3 times a day ? ?You have been referred to pharmacy for Ozempic they will contact you for further details ? ?At the [facility], you and your health needs are our priority. As part of our continuing mission to provide you with exceptional heart care, we have created designated Provider Care Teams. These Care Teams include your primary Cardiologist (physician) and Advanced Practice Providers (APPs- Physician Assistants and Nurse Practitioners) who all work together to provide you with the care you need, when you need it.  ? ?You may see any of the following providers on your designated Care Team at your next follow up: ?Dr [name] ?Dr Marca Ancona ?Tonye Becket, NP ?Robbie Lis, PA ?Jessica Milford,NP ?Anna Genre, PA ?Karle Plumber, PharmD ? ? ?Please be sure to bring in all your medications bottles to every appointment.  ? ?If you have any questions or concerns before your next appointment please send [place] a message through [place] or call our office at [phone].   ? ?TO LEAVE A MESSAGE FOR THE NURSE SELECT OPTION 2, PLEASE LEAVE A MESSAGE INCLUDING: ?YOUR NAME ?DATE OF BIRTH ?CALL BACK NUMBER ?REASON FOR CALL**this is important as we prioritize the call backs ? ?YOU WILL RECEIVE A CALL BACK THE SAME DAY AS LONG AS YOU CALL BEFORE 4:00 PM ? ?

## 2022-02-09 NOTE — Telephone Encounter (Signed)
Advanced Heart Failure Patient Advocate Encounter ? ?Received a message that the patient needs a new PA for Sildenafil (9 tablets per day). Called patient's insurance at [phone] and submitted quantity limit exception. Representative stated that it can take 4 days to receive a determination.  ? ?Case # [id number] ?

## 2022-02-10 ENCOUNTER — Telehealth: Payer: Self-pay | Admitting: Student-PharmD

## 2022-02-10 MED ORDER — WEGOVY 0.5 MG/0.5ML ~~LOC~~ SOAJ: 0.5000 mg | SUBCUTANEOUS | 0 refills | Status: DC

## 2022-02-10 NOTE — Telephone Encounter (Signed)
Received referral from [facility], NP, to start semaglutide for this patient for weight loss. They meet FDA approved criteria for semaglutide for use in obesity given BMI 49.23. Obesity is complicated by chronic conditions including T2DM, HTN, OSA.  ?  ?No contraindications seen in the chart to [facility] use. He is currently taking Trulicity 1.5 mg weekly. Per HF note, he has been on Ozempic in the past.  ?  ?Called patient to see if he was interested in starting Ozempic which would mean stopping Trulicity. Unable to reach, LVM requesting call back.  ? ?

## 2022-02-10 NOTE — Telephone Encounter (Signed)
Patient returned call. States that he started out around 440 lbs and has come down to 323 lbs but has plateaued with his weight loss. He is interested in switching to [place] or Wegovy from Trulicity to see if this can provide additional weight loss benefit.  ? ?Will submit PA to his insurance for [facility] first and see if it is approved.  ?

## 2022-02-10 NOTE — Telephone Encounter (Addendum)
Tried submitting PA for [facility] but it returned that Bradley English is already on formulary. Called the patient back to let him know of this. He took his last Trulicity dose last Sunday. Recommended he wait until this coming Sunday to start [facility]. Explained the difference in injection technique with Wegovy. Let him know about the copay savings card for [facility] to bring cost down to $0. If cost is extremely high, he will call back and we will try for Ozempic instead since he has DM.  ? ?Will call patient in a month for dose titration. Starting at 0.5 mg since he is switching from Trulicity 1.5 mg.  ?

## 2022-02-16 ENCOUNTER — Telehealth (HOSPITAL_COMMUNITY): Payer: Self-pay | Admitting: Vascular Surgery

## 2022-02-16 ENCOUNTER — Telehealth (HOSPITAL_COMMUNITY): Payer: Self-pay

## 2022-02-16 DIAGNOSIS — I5032 Chronic diastolic (congestive) heart failure: Secondary | ICD-10-CM

## 2022-02-16 NOTE — Telephone Encounter (Signed)
Left pt message giving VQ scan 02/26/22 @ 8:45 @ [facility] , asked pt to call back to confirm ?

## 2022-02-16 NOTE — Telephone Encounter (Signed)
Chest xray per [place] for PFTs ?

## 2022-02-17 MED ORDER — MOUNJARO 5 MG/0.5ML ~~LOC~~ SOAJ: 5.0000 mg | SUBCUTANEOUS | 0 refills | Status: DC

## 2022-02-17 NOTE — Addendum Note (Signed)
Addended by: Rebbeca Paul B on: 02/17/2022 01:32 PM ? ? Modules accepted: Orders ? ?

## 2022-02-17 NOTE — Telephone Encounter (Signed)
Mounjaro PA approved through 02/18/23 and has been sent to his pharmacy. Called his pharmacy to make sure it went through okay - on their end it went through and was covered.  ? ?Called patient to let him know and explain how Greggory Keen is administered. He is agreeable to this change in plan and will start [facility] when he is able to pick it up. Will call in a few weeks to see how he is tolerating this and increase the dose as tolerated.  ?

## 2022-02-17 NOTE — Telephone Encounter (Signed)
Patient called saying he tried to fill [facility] but it is not on formulary. When I tried submitting a PA it said "This medication or product is on your plan's list of covered drugs. Prior authorization is not required at this time." I called his pharmacy to see if they could tell me anything more and they said it just tells them "NDC not covered." Will try instead for [facility] since the patient has T2DM and the pen administration is the same as the Trulicity he is currently taking. Submitted Mounjaro PA 02/17/22.  ?

## 2022-02-19 NOTE — Telephone Encounter (Signed)
Advanced Heart Failure Patient Advocate Encounter ? ?Prior Authorization for Sildenafil has been approved.   ? ?PA# [id number] ?Effective dates: 02/09/22 through 02/10/23 ? ?[name], CPhT ? ? ?

## 2022-02-26 ENCOUNTER — Ambulatory Visit (HOSPITAL_COMMUNITY): Payer: 59

## 2022-02-26 ENCOUNTER — Other Ambulatory Visit (HOSPITAL_COMMUNITY): Payer: 59

## 2022-03-02 ENCOUNTER — Ambulatory Visit (HOSPITAL_COMMUNITY)
Admission: RE | Admit: 2022-03-02 | Discharge: 2022-03-02 | Disposition: A | Discharge status: Home / Self Care | Payer: 59 | Source: Ambulatory Visit | Attending: Internal Medicine | Admitting: Internal Medicine

## 2022-03-02 DIAGNOSIS — I5032 Chronic diastolic (congestive) heart failure: Secondary | ICD-10-CM | POA: Insufficient documentation

## 2022-03-02 MED ORDER — TECHNETIUM TO 99M ALBUMIN AGGREGATED
4.3700 | Freq: Once | INTRAVENOUS | Status: AC | PRN
  Administered 2022-03-02: 4.37 via INTRAVENOUS

## 2022-03-04 ENCOUNTER — Telehealth (HOSPITAL_COMMUNITY): Payer: Self-pay | Admitting: Pharmacy Technician

## 2022-03-04 NOTE — Telephone Encounter (Signed)
Patient Advocate Encounter ?  ?Received notification from [employer] that prior authorization for Bradley English is required. ?  ?PA submitted on CoverMyMeds ?Key [id number] ?Status is pending ?  ?Will continue to follow. ? ?

## 2022-03-06 NOTE — Telephone Encounter (Signed)
Advanced Heart Failure Patient Advocate Encounter  Prior Authorization for Bradley English has been approved.    PA# [id number] Effective dates: 03/05/22 through 03/02/23  [name], CPhT

## 2022-03-09 ENCOUNTER — Other Ambulatory Visit: Payer: Self-pay | Admitting: Internal Medicine

## 2022-03-09 ENCOUNTER — Other Ambulatory Visit (HOSPITAL_COMMUNITY): Payer: Self-pay | Admitting: Family Medicine

## 2022-03-10 ENCOUNTER — Telehealth: Payer: Self-pay | Admitting: Student-PharmD

## 2022-03-10 MED ORDER — MOUNJARO 7.5 MG/0.5ML ~~LOC~~ SOAJ: 7.5000 mg | SUBCUTANEOUS | 0 refills | Status: DC

## 2022-03-10 NOTE — Telephone Encounter (Signed)
Called patient to see how he is doing since switching from Trulicity 1.5 mg to Mounjaro 5 mg. If tolerating well, will plan to increase Mounjaro to 7.5 mg.   Unable to reach, LVM requesting call back.

## 2022-03-10 NOTE — Telephone Encounter (Signed)
Patient returned call. States he is doing well since switching from Trulicity to [place]. Says he is having no side effects or problems. He has taken four doses now of 5 mg and agrees to increasing the dose to 7.5 mg for greater weight loss benefit. Will call him in a few weeks to see how he is tolerating this dose and continue to increase to his max tolerated dose.

## 2022-04-07 ENCOUNTER — Telehealth: Payer: Self-pay | Admitting: Student-PharmD

## 2022-04-07 MED ORDER — MOUNJARO 10 MG/0.5ML ~~LOC~~ SOAJ: 10.0000 mg | SUBCUTANEOUS | 0 refills | Status: DC

## 2022-04-07 NOTE — Telephone Encounter (Signed)
Patient returned call. Stated that he is doing well since increasing Mounjaro to 7.5 mg and having no side effects. His weight was down slightly to 318 at his last office visit. He gave his 4th injection of this dose on Sunday. He would like to continue to increase the dose to 10 mg which has been sent in for him. Will call him again in 4 weeks to see how he is tolerating this dose increase and continue to titrate to maximally tolerated dose.

## 2022-04-07 NOTE — Telephone Encounter (Signed)
Called patient to see how he is doing since increasing Mounjaro to 7.5 mg weekly. If tolerating well and patient would like to continue to increase dose to optimize weight loss, could increase next to 10 mg weekly.   Unable to reach, LVM requesting call back.

## 2022-04-22 ENCOUNTER — Telehealth: Payer: Self-pay | Admitting: Pharmacist

## 2022-04-22 MED ORDER — MOUNJARO 10 MG/0.5ML ~~LOC~~ SOAJ: 10.0000 mg | SUBCUTANEOUS | 0 refills | Status: DC

## 2022-04-22 NOTE — Telephone Encounter (Signed)
Received message from CHF clinic that pt was having trouble getting Mounjaro filled. States he has been unable to get Mounjaro 10mg  filled. States [facility] told him they couldn't get any dose of Mounjaro at all. [facility]. Pt aware to call with any issues.

## 2022-04-23 MED ORDER — MOUNJARO 10 MG/0.5ML ~~LOC~~ SOAJ: 10.0000 mg | SUBCUTANEOUS | 0 refills | Status: DC

## 2022-04-23 NOTE — Telephone Encounter (Signed)
Pt called clinic again. States his insurance won't let him fill rx at [facility]. [facility] sent to [facility] instead to see if they can get rx in stock.

## 2022-04-23 NOTE — Addendum Note (Signed)
Addended by: Allyce Bochicchio E on: 04/23/2022 12:53 PM   Modules accepted: Orders

## 2022-05-01 ENCOUNTER — Other Ambulatory Visit (HOSPITAL_COMMUNITY): Payer: Self-pay | Admitting: *Deleted

## 2022-05-01 MED ORDER — SILDENAFIL CITRATE 20 MG PO TABS: 60.0000 mg | ORAL_TABLET | Freq: Three times a day (TID) | ORAL | 2 refills | Status: DC

## 2022-05-14 ENCOUNTER — Telehealth: Payer: Self-pay | Admitting: Pharmacist

## 2022-05-14 MED ORDER — TRULICITY 1.5 MG/0.5ML ~~LOC~~ SOAJ: 1.5000 mg | SUBCUTANEOUS | 0 refills | Status: DC

## 2022-05-14 NOTE — Telephone Encounter (Signed)
Pt returned call. States he was not able to pick up [facility], tried 3 different pharmacies and none had any Mounjaro in stock over 2.5mg . Hasn't had any Mounjaro since the 7.5mg  dose last month and reports that pharmacy can't get any more in stock either. Do not know when shortage of Greggory Keen will end. Will change pt back to Trulicity which he previously took. Will start back at his dose of 1.5mg  weekly and will plan to titrate over the next few months.

## 2022-05-14 NOTE — Telephone Encounter (Signed)
Called pt and left message to follow up with [facility] 10mg  tolerability. Will need to confirm he was able to pick up 10mg  dose since his pharmacy was having trouble filling it earlier this month. Will increase dose if tolerating well.

## 2022-05-26 ENCOUNTER — Other Ambulatory Visit (HOSPITAL_COMMUNITY): Payer: Self-pay | Admitting: *Deleted

## 2022-05-26 MED ORDER — UPTRAVI 200 MCG PO TABS: 200.0000 ug | ORAL_TABLET | Freq: Two times a day (BID) | ORAL | 11 refills | Status: DC

## 2022-06-04 ENCOUNTER — Encounter (HOSPITAL_COMMUNITY): Payer: Self-pay | Admitting: Internal Medicine

## 2022-06-04 ENCOUNTER — Ambulatory Visit (HOSPITAL_COMMUNITY)
Admission: RE | Admit: 2022-06-04 | Discharge: 2022-06-04 | Disposition: A | Discharge status: Home / Self Care | Payer: 59 | Source: Ambulatory Visit | Attending: Internal Medicine | Admitting: Internal Medicine

## 2022-06-04 VITALS — BP 132/78 | HR 63 | Wt 322.2 lb

## 2022-06-04 DIAGNOSIS — E119 Type 2 diabetes mellitus without complications: Secondary | ICD-10-CM | POA: Diagnosis not present

## 2022-06-04 DIAGNOSIS — I272 Pulmonary hypertension, unspecified: Secondary | ICD-10-CM | POA: Diagnosis present

## 2022-06-04 DIAGNOSIS — M79606 Pain in leg, unspecified: Secondary | ICD-10-CM | POA: Diagnosis not present

## 2022-06-04 DIAGNOSIS — I110 Hypertensive heart disease with heart failure: Secondary | ICD-10-CM | POA: Insufficient documentation

## 2022-06-04 DIAGNOSIS — R609 Edema, unspecified: Secondary | ICD-10-CM | POA: Diagnosis not present

## 2022-06-04 DIAGNOSIS — Z79899 Other long term (current) drug therapy: Secondary | ICD-10-CM | POA: Diagnosis not present

## 2022-06-04 DIAGNOSIS — G4733 Obstructive sleep apnea (adult) (pediatric): Secondary | ICD-10-CM | POA: Diagnosis not present

## 2022-06-04 DIAGNOSIS — I10 Essential (primary) hypertension: Secondary | ICD-10-CM | POA: Diagnosis not present

## 2022-06-04 DIAGNOSIS — Z87891 Personal history of nicotine dependence: Secondary | ICD-10-CM | POA: Diagnosis not present

## 2022-06-04 DIAGNOSIS — R0602 Shortness of breath: Secondary | ICD-10-CM | POA: Insufficient documentation

## 2022-06-04 DIAGNOSIS — Z7985 Long-term (current) use of injectable non-insulin antidiabetic drugs: Secondary | ICD-10-CM | POA: Insufficient documentation

## 2022-06-04 DIAGNOSIS — J449 Chronic obstructive pulmonary disease, unspecified: Secondary | ICD-10-CM | POA: Insufficient documentation

## 2022-06-04 DIAGNOSIS — M7989 Other specified soft tissue disorders: Secondary | ICD-10-CM | POA: Insufficient documentation

## 2022-06-04 LAB — BASIC METABOLIC PANEL
Anion gap: 7 (ref 5–15)
BUN: 31 mg/dL — ABNORMAL HIGH (ref 6–20)
CO2: 24 mmol/L (ref 22–32)
Calcium: 9.3 mg/dL (ref 8.9–10.3)
Chloride: 106 mmol/L (ref 98–111)
Creatinine, Ser: 1.36 mg/dL — ABNORMAL HIGH (ref 0.61–1.24)
GFR, Estimated: >60 mL/min (ref >60)
Glucose, Bld: 120 mg/dL — ABNORMAL HIGH (ref 70–99)
Potassium: 4.7 mmol/L (ref 3.5–5.1)
Sodium: 137 mmol/L (ref 135–145)

## 2022-06-04 LAB — BRAIN NATRIURETIC PEPTIDE: B Natriuretic Peptide: 358.6 pg/mL — ABNORMAL HIGH (ref 0.0–100.0)

## 2022-06-04 MED ORDER — SILDENAFIL CITRATE 20 MG PO TABS: 80.0000 mg | ORAL_TABLET | Freq: Three times a day (TID) | ORAL | 11 refills | Status: AC

## 2022-06-04 NOTE — Addendum Note (Signed)
Encounter addended by: Levonne Spiller, RN on: 06/04/2022 11:11 AM  Actions taken: Clinical Note Signed

## 2022-06-04 NOTE — Progress Notes (Signed)
Pt completed 1350 feet during six minute walk with no issues.  Hessie Diener RN

## 2022-06-04 NOTE — Addendum Note (Signed)
Encounter addended by: Levonne Spiller, RN on: 06/04/2022 10:27 AM  Actions taken: Diagnosis association updated, Order list changed

## 2022-06-04 NOTE — Addendum Note (Signed)
Encounter addended by: Levonne Spiller, RN on: 06/04/2022 10:59 AM  Actions taken: Clinical Note Signed

## 2022-06-04 NOTE — Progress Notes (Signed)
ADVANCED HF CLINIC NOTE PCP: Coralee Rud, PA-C Pulmonary: Dr. Gerome Apley HF Cardiologist: Dr. Gala Romney  HPI: Mr. Oleson is a 56 y.o.male with HTN, morbid obesity, COPD, severe OSA, DM2, former tobacco use referred by Arnette Felts PA-C for further evaluation of pulmonary HTN   Smoked 1.5 ppd x 30 years quit 1/21   Has worked in Landscape architect with re-upholstering. In 10/20 started to notice more LE swelling and SOB. Went to ER at [facility]. Had chest CT (negative for PE) and LE u/s negative.   Echo 7/21: LVEF 55-60% RV severely dilated with moderately decreased function. Flattened septum   We saw him for the first time in 7/21 with Class IIIB symptoms and underwent R/L cath. 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 Sildenafil started and felt like it really helped.   Echo 10/25/20: LVEF 65% RV markedly dilated and hypokinetic D-shaped septum.   Macitentan added 1/22.  RHC 5/22  RA = 11 RV = 101/14 PA = 100/34 (58) PCW = 17 Fick cardiac output/index = 9.0/3.5 PVR = 4.6 WU Ao sat = 93% PA sat = 69%, 70%  Follow up 12/22, NYHA II, volume ok. Tolerating selexipeg 200 bid well, 400 bid caused severe leg pain.   Echo 10/03/21: EF 60-65% RV dilated severely HK. Septum flat. RVSP ~ 80   RHC 12/22 showed severe PAH despite triple therapy. Sildenafil increased to 60 tid. Consider switch to Adempas.    RA = 8 RV = 107/18 PA = 104/32 (56) PCW = 6 Fick cardiac output/index = 6.3/2.6 PVR = 7.9 WU PAPi = 9.0 Ao sat = 97% PA sat = 67%, 72% SVC sat = 70%  Today he returns for HF follow up. Feels better than he has felt in a long time. NYHA II as long as it isn't too hot. Unable to tolerate any more than selexipag 200 bid. Also taking sildeanfil 60 tid and macitentan 10mg  daily. Taking lasix 40mg  daily with control of edema. Still working FT  Cardiac Studies:  - RHC (12/22): RA = 8 RV = 107/18 PA = 104/32 (56) PCW = 6 Fick cardiac output/index =  6.3/2.6 PVR = 7.9 WU PAPi = 9.0 Ao sat = 97% PA sat = 67%, 72% SVC sat = 70%  - Echo (12/22): EF 60-65% RV dilated severely HK. Septum flat. RVSP ~ 80   - 03-11-1999 1/22 243 m (800 feet)   - 9/22: 426 m (1400 feet)  - Cath 7/21 1. Normal coronary arteries with left dominant system and separate LAD and LCX ostia 2. LVEF 65% 3. Severe PAH Ao =95/67 (80) LV = 100/12 RA = 17 RV = 88/23 PA = 90/39 (58) PCW = 8 Fick cardiac output/index = 4.5/1.7 Thermo CO/CI = 6.0/2.3 PVR = 8.2 WU FA sat = 97% PA sat = 59%, 61%  - PFTs 11/20 FEV1 1.93 (57% FVC 2.66 (64%) FEF 25-75% 35% DLCO 63%  Current Outpatient Medications  Medication Sig Dispense Refill   aspirin EC 81 MG tablet Take 81 mg by mouth in the morning. Swallow whole.     atorvastatin (LIPITOR) 10 MG tablet Take 10 mg by mouth at bedtime.      carvedilol (COREG) 12.5 MG tablet Take 12.5 mg by mouth 2 (two) times daily.     Dulaglutide (TRULICITY) 1.5 MG/0.5ML SOPN Inject 1.5 mg into the skin once a week. 2 mL 0   furosemide (LASIX) 40 MG tablet Take 1 tablet (40  mg total) by mouth daily. 30 tablet 3   ibuprofen (ADVIL) 200 MG tablet Take 800 mg by mouth every 8 (eight) hours as needed (headache).     macitentan (OPSUMIT) 10 MG tablet Take 1 tablet (10 mg total) by mouth daily. 30 tablet 3   Selexipag (UPTRAVI) 200 MCG TABS Take 1 tablet (200 mcg total) by mouth 2 (two) times daily. 60 tablet 11   sildenafil (REVATIO) 20 MG tablet Take 3 tablets (60 mg total) by mouth 3 (three) times daily. 180 tablet 2   spironolactone (ALDACTONE) 25 MG tablet Take 1 tablet by mouth once daily 30 tablet 6   testosterone cypionate (DEPOTESTOSTERONE CYPIONATE) 200 MG/ML injection 200 mg every 14 (fourteen) days.     Vitamin D, Ergocalciferol, (DRISDOL) 1.25 MG (50000 UNIT) CAPS capsule Take 50,000 Units by mouth once a week. Monday     No current facility-administered medications for this encounter.   Allergies  Allergen Reactions    Jardiance [Empagliflozin] Rash   Social History   Socioeconomic History   Marital status: Married    Spouse name: Not on file   Number of children: Not on file   Years of education: Not on file   Highest education level: Not on file  Occupational History   Not on file  Tobacco Use   Smoking status: Former   Smokeless tobacco: Former  Building services engineer Use: Never used  Substance and Sexual Activity   Alcohol use: Not Currently   Drug use: Not on file   Sexual activity: Not on file  Other Topics Concern   Not on file  Social History Narrative   Not on file   Social Determinants of Health   Financial Resource Strain: Not on file  Food Insecurity: Not on file  Transportation Needs: Not on file  Physical Activity: Not on file  Stress: Not on file  Social Connections: Not on file  Intimate Partner Violence: Not on file   FHx: M died to cancer D died due to MVA PGF died to HF Brother stage IV kidney CA Sister died due to ETOH cirrhosis  BP 132/78   Pulse 63   Wt (!) 146.1 kg (322 lb 3.2 oz)   SpO2 93%   BMI 48.99 kg/m   Wt Readings from Last 3 Encounters:  06/04/22 (!) 146.1 kg (322 lb 3.2 oz)  02/09/22 (!) 146.9 kg (323 lb 12.8 oz)  10/07/21 (!) 144.2 kg (318 lb)   PHYSICAL EXAM: General:  NAD. No resp difficulty HEENT: normal Neck: supple. Thick neck hard to see JVP Carotids 2+ bilat; no bruits. No lymphadenopathy or thryomegaly appreciated. Cor: PMI nondisplaced. Regular rate & rhythm. No rubs, gallops or murmurs. Lungs: clear Abdomen: obese soft, nontender, nondistended. No hepatosplenomegaly. No bruits or masses. Good bowel sounds. Extremities: no cyanosis, clubbing, rash, edema + varicose veins Neuro: alert & orientedx3, cranial nerves grossly intact. moves all 4 extremities w/o difficulty. Affect pleasant   ASSESSMENT & PLAN: 1. Pulmonary HTN/cor pulmonale - suspect mostly WHO Group III but may  Have component of WHO Group 2 +/- anorexigen meds  (Phenteramine). - Suspect WHO group I & III. - CT chest and LE dopplers negative for DVT/PE - Has severe OSA -> continue CPAP. - PFTs reviewed and reflect moderate restriction/obstruction. - -VQ 5/23 negative - RHC 7/21 with severe PAH. PA 90/39 (58) PCWP 8 PVR 8.2 WU.  - Echo 10/25/20: EF 60-65%, RV markedly dilated and HK RV flattened septum, markedly dilated -  Repeat RHC 5/22: PA = 100/34 (58) PCW = 17 Fick cardiac output/index = 9.0/3.5 PVR = 4.6 WU - 1/22 236m (800 feet)  - 9/22 426 m (1400 ft) - Echo 10/03/21: EF 60-65% RV dilated severely HK. Septum flat. RVSP ~ 80. - Repeat RHC 12/22 showed severe PAH despite triple therapy. - Functionally much improved with triple therapy, but echo and cath still with significant PH and RV strain  - NYHA II Reveal Lite Risk Score = 7 (intermediate risk) - Increase sildenafil to 80 tid - Continue macitentan 10 mg daily and selex 200 bid (selex 400 bid caused severe leg pain). - Consider switch to Adempas. - Continue Lasix 40 mg daily. - Continue spiro 25 mg daily. - Overall PA pressures remain very high on triple therapy however he feels quite good. Will increase sildenafil to 80 tid. Will repat and echo. If still with significant RV strain repeat RHC with eye toward discussing IV therapies with [facility] though this would be major impact on his work and QoL. May be an option to wait until sotaracept is approved to try that as 4th agent.   2. Morbid obesity - Continue weight loss efforts - Set a goal for him for under 300 pounds. - Now on Trulicity (unable to get [facility] due to shortage)   3. HTN - BP ok.   4. Severe OSA - Religiously compliant with CPAP.   [name], MD  10:05 AM

## 2022-06-04 NOTE — Patient Instructions (Signed)
Increase Sildenafil to 80 mg three times daily (Rx sent). Will schedule your Echo and call you with date and time. Return to Heart Failure Clinic in 2 - 3 months.

## 2022-06-08 ENCOUNTER — Telehealth: Payer: Self-pay | Admitting: Pharmacist

## 2022-06-08 MED ORDER — TRULICITY 3 MG/0.5ML ~~LOC~~ SOAJ: 3.0000 mg | SUBCUTANEOUS | 0 refills | Status: DC

## 2022-06-08 NOTE — Addendum Note (Signed)
Addended by: Tollie Canada E on: 06/08/2022 09:52 AM   Modules accepted: Orders

## 2022-06-08 NOTE — Telephone Encounter (Signed)
Called pt to follow up with Trulicity titration and left message. Previously started on [facility] but unable to get due to shortage and was changed back to Trulicity. If tolerating well, will increase from 1.5mg  to 3mg  weekly, then final titration to 4.5mg  weekly in another month.

## 2022-06-08 NOTE — Telephone Encounter (Signed)
Spoke with pt, tolerating Trulicity well. Just gave his 4th dose of 1.5mg . Will send in refill for next dose of 3mg  and call pt in 1 more month for last dose titration to 4.5mg .

## 2022-06-10 ENCOUNTER — Other Ambulatory Visit (HOSPITAL_COMMUNITY): Payer: Self-pay

## 2022-06-10 DIAGNOSIS — I5022 Chronic systolic (congestive) heart failure: Secondary | ICD-10-CM

## 2022-06-10 NOTE — Progress Notes (Signed)
Orders Placed This Encounter  Procedures   ECHOCARDIOGRAM COMPLETE    Standing Status:   Future    Standing Expiration Date:   06/11/2023    Order Specific Question:   Where should this test be performed    Answer:   [place]    Order Specific Question:   Perflutren DEFINITY (image enhancing agent) should be administered unless hypersensitivity or allergy exist    Answer:   Administer Perflutren    Order Specific Question:   Reason for exam-Echo    Answer:   Congestive Heart Failure  I50.9    Order Specific Question:   Release to patient    Answer:   Immediate

## 2022-06-18 ENCOUNTER — Ambulatory Visit (HOSPITAL_COMMUNITY)
Admission: RE | Admit: 2022-06-18 | Discharge: 2022-06-18 | Disposition: A | Discharge status: Home / Self Care | Payer: 59 | Source: Ambulatory Visit | Attending: Internal Medicine | Admitting: Internal Medicine

## 2022-06-18 DIAGNOSIS — I2729 Other secondary pulmonary hypertension: Secondary | ICD-10-CM | POA: Insufficient documentation

## 2022-06-18 DIAGNOSIS — I5022 Chronic systolic (congestive) heart failure: Secondary | ICD-10-CM | POA: Insufficient documentation

## 2022-06-18 DIAGNOSIS — I2781 Cor pulmonale (chronic): Secondary | ICD-10-CM | POA: Insufficient documentation

## 2022-06-18 DIAGNOSIS — G4730 Sleep apnea, unspecified: Secondary | ICD-10-CM | POA: Diagnosis not present

## 2022-06-18 DIAGNOSIS — Z87891 Personal history of nicotine dependence: Secondary | ICD-10-CM | POA: Insufficient documentation

## 2022-06-18 DIAGNOSIS — I110 Hypertensive heart disease with heart failure: Secondary | ICD-10-CM | POA: Insufficient documentation

## 2022-06-18 LAB — ECHOCARDIOGRAM COMPLETE
Area-P 1/2: 1.91 cm2
S' Lateral: 2.3 cm

## 2022-07-07 MED ORDER — TRULICITY 4.5 MG/0.5ML ~~LOC~~ SOAJ: 4.5000 mg | SUBCUTANEOUS | 11 refills | Status: DC

## 2022-07-07 NOTE — Addendum Note (Signed)
Addended by: Marcelle Overlie D on: 07/07/2022 11:44 AM   Modules accepted: Orders

## 2022-07-07 NOTE — Telephone Encounter (Signed)
Called pt to see how he was doing on Trulicity. No side effects. He is doing well. Would like to increase to last dose of 4.5mg  weekly. Rx sent. Follow up in 4 weeks.

## 2022-07-13 ENCOUNTER — Other Ambulatory Visit (HOSPITAL_COMMUNITY): Payer: Self-pay | Admitting: Internal Medicine

## 2022-07-22 ENCOUNTER — Other Ambulatory Visit (HOSPITAL_COMMUNITY): Payer: Self-pay

## 2022-07-22 DIAGNOSIS — I5022 Chronic systolic (congestive) heart failure: Secondary | ICD-10-CM

## 2022-07-22 MED ORDER — MACITENTAN 10 MG PO TABS: 10.0000 mg | ORAL_TABLET | Freq: Every day | ORAL | 3 refills | Status: DC

## 2022-08-10 MED ORDER — MOUNJARO 12.5 MG/0.5ML ~~LOC~~ SOAJ: 12.5000 mg | SUBCUTANEOUS | 0 refills | Status: DC

## 2022-08-10 NOTE — Addendum Note (Signed)
Addended by: Marcelle Overlie D on: 08/10/2022 02:52 PM   Modules accepted: Orders

## 2022-08-10 NOTE — Telephone Encounter (Signed)
Spoke with patient. He states he isn't having any side effects, but not really loosing any weight. Will switch back to [facility] 12.5mg  weekly. Shouldn't have any supply issues at this dose that are known.

## 2022-08-10 NOTE — Telephone Encounter (Signed)
Attempted to call patient x 2. Left a voice mail with instructions to call the clinic to discuss Trulicity.

## 2022-09-01 ENCOUNTER — Telehealth: Payer: Self-pay | Admitting: Pharmacist

## 2022-09-01 ENCOUNTER — Telehealth (HOSPITAL_COMMUNITY): Payer: Self-pay | Admitting: Pharmacy Technician

## 2022-09-01 NOTE — Telephone Encounter (Signed)
Patient Advocate Encounter   Received notification from [employer] that prior authorization for Opsumit is required.   PA submitted on CoverMyMeds Key [id number] Status is pending   Will continue to follow.

## 2022-09-01 NOTE — Telephone Encounter (Signed)
Called pt to see how he was doing on Mounjaro 12.5mg  and see if he would like to increase to 15mg  weekly. LVM for patient to call back.

## 2022-09-02 NOTE — Telephone Encounter (Signed)
Advanced Heart Failure Patient Advocate Encounter  Prior Authorization for Opsumit has been approved.    PA# [id number] Effective dates: 09/02/22 through 09/02/23  [name], CPhT

## 2022-09-03 MED ORDER — MOUNJARO 15 MG/0.5ML ~~LOC~~ SOAJ: 15.0000 mg | SUBCUTANEOUS | 3 refills | Status: AC

## 2022-09-03 NOTE — Telephone Encounter (Signed)
Patient called back  Reports tolerating Mounjaro 12.5 mg dose well without any side effects. Patient is in agreement to up the dose to 15 mg weekly. Was switch to Trulicity in the past due to [facility] supply issue but since on Mounjaro 12.5 mg (last month) has lost 5-6 lbs.  Patient follows healthy diet with lots of protein and low carb. Does not do regular physical activity as on his feet at work (10 hrs/day) but motivated to start regular brisk walking 15-30 min per day.   Prescription for Mounjaro 15 mg 3 months supply with 3 refills sent tot he preferred pharmacy.

## 2022-09-05 NOTE — Progress Notes (Signed)
ADVANCED HF CLINIC NOTE PCP: Coralee Rud, PA-C Pulmonary: Dr. Gerome Apley HF Cardiologist: Dr. Gala Romney  HPI: Bradley English is a 56 y.o.male with HTN, morbid obesity, COPD, severe OSA, DM2, former tobacco use referred by Arnette Felts PA-C for further evaluation of pulmonary HTN   Smoked 1.5 ppd x 30 years quit 1/21   Has worked in Landscape architect with re-upholstering. In 10/20 started to notice more LE swelling and SOB. Went to ER at [facility]. Had chest CT (negative for PE) and LE u/s negative.   Echo 7/21: LVEF 55-60% RV severely dilated with moderately decreased function. Flattened septum   We saw him for the first time in 7/21 with Class IIIB symptoms and underwent R/L cath. 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 Sildenafil started and felt like it really helped.   Echo 10/25/20: LVEF 65% RV markedly dilated and hypokinetic D-shaped septum.   RHC 5/22  RA = 11 RV = 101/14 PA = 100/34 (58) PCW = 17 Fick cardiac output/index = 9.0/3.5 PVR = 4.6 WU Ao sat = 93% PA sat = 69%, 70%  Follow up 12/22, NYHA II, volume ok. Tolerating selexipeg 200 bid well, 400 bid caused severe leg pain.   Echo 10/03/21: EF 60-65% RV dilated severely HK. Septum flat. RVSP ~ 80   RHC 12/22 showed severe PAH despite triple therapy. Sildenafil increased to 60 tid. Consider switch to Adempas.    RA = 8 RV = 107/18 PA = 104/32 (56) PCW = 6 Fick cardiac output/index = 6.3/2.6 PVR = 7.9 WU PAPi = 9.0 Ao sat = 97% PA sat = 67%, 72% SVC sat = 70%  Echo 8/23: LVEF 65-70%RV moderately reduced Mild TR RVSP 118  Today he returns for HF follow up. Has lost 10 pounds. Has good days and bad days. Can do Ok on flat ground but gets wiped out and presyncopal with steps. No edema, orthopnea or PND. Compliant with meds and CPAP. Still trying to work FT in Landscape architect.     Ancillary data:   - 1/22 243 m (800 feet)  - 9/22: 426 m (1400 feet) - 8/23: 451m  (1350 feet)  - PFTs 11/20 FEV1 1.93 (57% FVC 2.66 (64%) FEF 25-75% 35% DLCO 63%  Current Outpatient Medications  Medication Sig Dispense Refill   aspirin EC 81 MG tablet Take 81 mg by mouth in the morning. Swallow whole.     atorvastatin (LIPITOR) 10 MG tablet Take 10 mg by mouth at bedtime.      carvedilol (COREG) 12.5 MG tablet Take 12.5 mg by mouth 2 (two) times daily.     furosemide (LASIX) 40 MG tablet Take 1 tablet (40 mg total) by mouth daily. 30 tablet 3   ibuprofen (ADVIL) 200 MG tablet Take 800 mg by mouth every 8 (eight) hours as needed (headache).     macitentan (OPSUMIT) 10 MG tablet Take 1 tablet (10 mg total) by mouth daily. 30 tablet 3   Selexipag (UPTRAVI) 200 MCG TABS Take 1 tablet (200 mcg total) by mouth 2 (two) times daily. 60 tablet 11   sildenafil (REVATIO) 20 MG tablet Take 4 tablets (80 mg total) by mouth 3 (three) times daily. 360 tablet 11   spironolactone (ALDACTONE) 25 MG tablet Take 1 tablet by mouth once daily 90 tablet 3   testosterone cypionate (DEPOTESTOSTERONE CYPIONATE) 200 MG/ML injection 200 mg every 14 (fourteen) days.     tirzepatide [facility]) 15 MG/0.5ML  Pen Inject 15 mg into the skin once a week. Dose increased from 12.5 mg weekly to 15 mg weekly 6 mL 3   Vitamin D, Ergocalciferol, (DRISDOL) 1.25 MG (50000 UNIT) CAPS capsule Take 50,000 Units by mouth once a week. Monday     No current facility-administered medications for this encounter.   Allergies  Allergen Reactions   Jardiance [Empagliflozin] Rash   Social History   Socioeconomic History   Marital status: Married    Spouse name: Not on file   Number of children: Not on file   Years of education: Not on file   Highest education level: Not on file  Occupational History   Not on file  Tobacco Use   Smoking status: Former   Smokeless tobacco: Former  Building services engineer Use: Never used  Substance and Sexual Activity   Alcohol use: Not Currently   Drug use: Not on file   Sexual  activity: Not on file  Other Topics Concern   Not on file  Social History Narrative   Not on file   Social Determinants of Health   Financial Resource Strain: Not on file  Food Insecurity: Not on file  Transportation Needs: Not on file  Physical Activity: Not on file  Stress: Not on file  Social Connections: Not on file  Intimate Partner Violence: Not on file   FHx: M died to cancer D died due to MVA PGF died to HF Brother stage IV kidney CA Sister died due to ETOH cirrhosis  BP 110/68   Pulse 79   Wt (!) 142.2 kg (313 lb 9.6 oz)   SpO2 94%   BMI 47.68 kg/m   Wt Readings from Last 3 Encounters:  09/07/22 (!) 142.2 kg (313 lb 9.6 oz)  06/04/22 (!) 146.1 kg (322 lb 3.2 oz)  02/09/22 (!) 146.9 kg (323 lb 12.8 oz)   PHYSICAL EXAM: General:  Well appearing. No resp difficulty HEENT: normal Neck: supple. JVP hard to see. Carotids 2+ bilat; no bruits. No lymphadenopathy or thryomegaly appreciated. Cor: PMI nondisplaced. Regular rate & rhythm. No rubs, gallops or murmurs. Lungs: clear Abdomen: obese soft, nontender, nondistended. No hepatosplenomegaly. No bruits or masses. Good bowel sounds. Extremities: no cyanosis, clubbing, rash, edema Neuro: alert & orientedx3, cranial nerves grossly intact. moves all 4 extremities w/o difficulty. Affect pleasant   ASSESSMENT & PLAN: 1. Pulmonary HTN/cor pulmonale - suspect mostly WHO Group III but may  Have component of WHO Group 2 +/- anorexigen meds (Phenteramine). - Suspect WHO group I & III. - CT chest and LE dopplers negative for DVT/PE - Has severe OSA -> continue CPAP. - PFTs reviewed and reflect moderate restriction/obstruction. - VQ 5/23 negative - RHC 7/21 with severe PAH. PA 90/39 (58) PCWP 8 PVR 8.2 WU.  - Echo 10/25/20: EF 60-65%, RV markedly dilated and HK RV flattened septum, markedly dilated - Repeat RHC 5/22: PA = 100/34 (58) PCW = 17 Fick cardiac output/index = 9.0/3.5 PVR = 4.6 WU - 1/22 238m (800 feet)  -  9/22 426 m (1400 ft) - Echo 10/03/21: EF 60-65% RV dilated severely HK. Septum flat. RVSP ~ 80. - Repeat RHC 12/22 showed severe PAH despite triple therapy. - Echo 8/23: LVEF 65-70%RV moderately reduced Mild TR RVSP 118 - Functionally much improved with triple therapy, but echo and cath still with significant PH and RV strain  - NYHA II Reveal Lite Risk Score = 7 (intermediate risk) - Continue sildenafil 80 tid -  Continue macitentan 10 mg daily and selex 200 bid (selex 400 bid caused severe leg pain). - Consider switch to Adempas. - Continue Lasix 40 mg daily. - Continue spiro 25 mg daily. - We previously discussed IV therapies with [facility] though this would be major impact on his work and QoL. May be an option to wait until sotaracept is approved to try that as 4th agent.  - We discussed options. He would like to avoid IV therapies if possible. Will see back in 3 months. If symptoms stable will repeat RHC and consider sotartarcept. If sx worse will refer for IV therapies.  - Labs today  2. Morbid obesity - Continue weight loss efforts - Set a goal for him for under 300 pounds. - Now on Trulicity (unable to get [facility] due to shortage)   3. HTN - Blood pressure well controlled. Continue current regimen.  4. Severe OSA - Very compliant with CPAP.   [name], MD  9:50 AM

## 2022-09-07 ENCOUNTER — Ambulatory Visit (HOSPITAL_COMMUNITY)
Admission: RE | Admit: 2022-09-07 | Discharge: 2022-09-07 | Disposition: A | Discharge status: Home / Self Care | Payer: 59 | Source: Ambulatory Visit | Attending: Internal Medicine | Admitting: Internal Medicine

## 2022-09-07 ENCOUNTER — Encounter (HOSPITAL_COMMUNITY): Payer: Self-pay | Admitting: Internal Medicine

## 2022-09-07 VITALS — BP 110/68 | HR 79 | Wt 313.6 lb

## 2022-09-07 DIAGNOSIS — J449 Chronic obstructive pulmonary disease, unspecified: Secondary | ICD-10-CM | POA: Diagnosis not present

## 2022-09-07 DIAGNOSIS — Z87891 Personal history of nicotine dependence: Secondary | ICD-10-CM | POA: Diagnosis not present

## 2022-09-07 DIAGNOSIS — G4733 Obstructive sleep apnea (adult) (pediatric): Secondary | ICD-10-CM | POA: Diagnosis not present

## 2022-09-07 DIAGNOSIS — E119 Type 2 diabetes mellitus without complications: Secondary | ICD-10-CM | POA: Insufficient documentation

## 2022-09-07 DIAGNOSIS — I5022 Chronic systolic (congestive) heart failure: Secondary | ICD-10-CM | POA: Diagnosis not present

## 2022-09-07 DIAGNOSIS — I272 Pulmonary hypertension, unspecified: Secondary | ICD-10-CM | POA: Diagnosis not present

## 2022-09-07 DIAGNOSIS — I110 Hypertensive heart disease with heart failure: Secondary | ICD-10-CM | POA: Diagnosis not present

## 2022-09-07 DIAGNOSIS — Z79899 Other long term (current) drug therapy: Secondary | ICD-10-CM | POA: Diagnosis not present

## 2022-09-07 DIAGNOSIS — M79606 Pain in leg, unspecified: Secondary | ICD-10-CM | POA: Diagnosis not present

## 2022-09-07 LAB — COMPREHENSIVE METABOLIC PANEL
ALT: 19 U/L (ref 0–44)
AST: 20 U/L (ref 15–41)
Albumin: 4.3 g/dL (ref 3.5–5.0)
Alkaline Phosphatase: 78 U/L (ref 38–126)
Anion gap: 10 (ref 5–15)
BUN: 28 mg/dL — ABNORMAL HIGH (ref 6–20)
CO2: 23 mmol/L (ref 22–32)
Calcium: 9.4 mg/dL (ref 8.9–10.3)
Chloride: 104 mmol/L (ref 98–111)
Creatinine, Ser: 1.54 mg/dL — ABNORMAL HIGH (ref 0.61–1.24)
GFR, Estimated: 53 mL/min — ABNORMAL LOW (ref >60)
Glucose, Bld: 112 mg/dL — ABNORMAL HIGH (ref 70–99)
Potassium: 4.7 mmol/L (ref 3.5–5.1)
Sodium: 137 mmol/L (ref 135–145)
Total Bilirubin: 1.2 mg/dL (ref 0.3–1.2)
Total Protein: 7.8 g/dL (ref 6.5–8.1)

## 2022-09-07 LAB — CBC
HCT: 47.7 % (ref 39.0–52.0)
Hemoglobin: 15.6 g/dL (ref 13.0–17.0)
MCH: 30.5 pg (ref 26.0–34.0)
MCHC: 32.7 g/dL (ref 30.0–36.0)
MCV: 93.2 fL (ref 80.0–100.0)
Platelets: 206 10*3/uL (ref 150–400)
RBC: 5.12 MIL/uL (ref 4.22–5.81)
RDW: 14.2 % (ref 11.5–15.5)
WBC: 9.3 10*3/uL (ref 4.0–10.5)
nRBC: 0 % (ref 0.0–0.2)

## 2022-09-07 LAB — BRAIN NATRIURETIC PEPTIDE: B Natriuretic Peptide: 304.4 pg/mL — ABNORMAL HIGH (ref 0.0–100.0)

## 2022-09-07 NOTE — Patient Instructions (Signed)
There has been no changes to your medications.  Labs done today, your results will be available in MyChart, we will contact you for abnormal readings.  Your physician recommends that you schedule a follow-up appointment in: 3 months ( February 2024) ** please call the office in Mid December to arrange your follow up appointment **  If you have any questions or concerns before your next appointment please send us a message through mychart or call our office at [phone].    TO LEAVE A MESSAGE FOR THE NURSE SELECT OPTION 2, PLEASE LEAVE A MESSAGE INCLUDING: YOUR NAME DATE OF BIRTH CALL BACK NUMBER REASON FOR CALL**this is important as we prioritize the call backs  YOU WILL RECEIVE A CALL BACK THE SAME DAY AS LONG AS YOU CALL BEFORE 4:00 PM  At the [facility], you and your health needs are our priority. As part of our continuing mission to provide you with exceptional heart care, we have created designated Provider Care Teams. These Care Teams include your primary Cardiologist (physician) and Advanced Practice Providers (APPs- Physician Assistants and Nurse Practitioners) who all work together to provide you with the care you need, when you need it.   You may see any of the following providers on your designated Care Team at your next follow up: Dr Daniel Bensimhon Dr Dalton McLean Dr. Aditya Sabharwal Amy Clegg, NP Brittainy Simmons, PA Jessica Milford,NP Lindsay Finch, PA Alma Diaz, NP Lauren Kemp, PharmD   Please be sure to bring in all your medications bottles to every appointment.    

## 2022-09-07 NOTE — Addendum Note (Signed)
Encounter addended by: Linda Hedges, RN on: 09/07/2022 10:14 AM  Actions taken: Order list changed, Diagnosis association updated, Clinical Note Signed, Charge Capture section accepted

## 2022-09-07 NOTE — Progress Notes (Signed)
  Heart and Vascular Care Navigation  09/07/2022  Taylor Levick 1966/05/13 [id number]  Reason for Referral: Discuss application process for disability.   Engaged with patient face to face for initial visit for Heart and Vascular Care Coordination.                                                                                                   Assessment:  CSW met with pt to discuss MD recommendation to apply for disability.  Pt currently working full time as a Librarian, academic for an [employer] which he says is stressful and finds it hard to work long days as he has been.  Pt is interested in applying for disability as he understands from MD he likely won't be able to keep working.  He reports he has LTD benefits through his work that he can apply for so this will hopefully allow him to have income while he works on applying for [employer] through [employer].  Pt will plan to start the process for LTC and SSDI as soon as possible.  CSW provided with information on how to apply for SSDI and provided with my card in case he has further questions.  Pt worried about insurance coverage.  States that the [facility] plan through work would be cost prohibitive for him.  Discussed applying for [facility] insurance- he will look into this for an idea of cost and will plan to sign up once he knows an end date for his current insurance.  CSW informed pt of Patient Care fund and ability for [place] to help him with some emergency expenses if need be.                                   HRT/VAS Care Coordination     None       Follow-up plan:    Pt to initiate disability process and reach out for further assistance as needed.  Jorge Ny, LCSW Clinical Social Worker Advanced Heart Failure Clinic Desk#: [phone] Cell#: [phone]

## 2022-09-07 NOTE — Addendum Note (Signed)
Encounter addended by: Burna Sis, LCSW on: 09/07/2022 12:16 PM  Actions taken: Clinical Note Signed

## 2022-09-07 NOTE — Addendum Note (Signed)
Encounter addended by: Linda Hedges, RN on: 09/07/2022 10:20 AM  Actions taken: Order list changed, Diagnosis association updated

## 2022-10-02 ENCOUNTER — Encounter (HOSPITAL_COMMUNITY): Payer: Self-pay

## 2022-10-02 ENCOUNTER — Inpatient Hospital Stay: Admit: 2022-10-02 | Payer: 59 | Admitting: Cardiology

## 2022-10-02 NOTE — H&P (Incomplete)
Cardiology Admission History and Physical   Patient ID: Bradley English MRN: [id number]; DOB: [birth year]   Admission date: (Not on file)  PCP:  Coralee Rud, PA-C   [facility] Providers Cardiologist:  None   { Click here to update MD or APP on Care Team, Refresh:1}     Chief Complaint: Syncope  Patient Profile:   Bradley English is a 56 y.o. male with a PMH of severe pulmonary hypertension (on macitentan, selexipag, sildenafil), COPD, severe OSA, HFpEF (EF >55%), DM2, and prior tobacco use who is being seen 10/02/2022 for the evaluation of syncope.  History of Present Illness:   Bradley English ***  The patient was recently seen in clinic on 09/07/2022 by Dr. Gala Romney.  During that visit he reported having presyncopal events with ambulation, but had not had true syncope until now.  He was otherwise stable and his current pulmonary hypertension regimen was maintained.   Past Medical History:  Diagnosis Date   CHF (congestive heart failure) (HCC)     Past Surgical History:  Procedure Laterality Date   MULTIPLE TOOTH EXTRACTIONS  06/2020   RIGHT HEART CATH N/A 03/03/2021   Procedure: RIGHT HEART CATH;  Surgeon: Dolores Patty, MD;  Location: [facility];  Service: Cardiovascular;  Laterality: N/A;   RIGHT HEART CATH N/A 10/07/2021   Procedure: RIGHT HEART CATH;  Surgeon: Dolores Patty, MD;  Location: [facility];  Service: Cardiovascular;  Laterality: N/A;   RIGHT/LEFT HEART CATH AND CORONARY ANGIOGRAPHY N/A 05/16/2020   Procedure: RIGHT/LEFT HEART CATH AND CORONARY ANGIOGRAPHY;  Surgeon: Dolores Patty, MD;  Location: [facility];  Service: Cardiovascular;  Laterality: N/A;     Medications Prior to Admission: Prior to Admission medications   Medication Sig Start Date End Date Taking? Authorizing Provider  aspirin EC 81 MG tablet Take 81 mg by mouth in the morning. Swallow whole.    [provider]  atorvastatin  (LIPITOR) 10 MG tablet Take 10 mg by mouth at bedtime.  11/20/16   [provider]  carvedilol (COREG) 12.5 MG tablet Take 12.5 mg by mouth 2 (two) times daily. 05/04/20   [provider]  furosemide (LASIX) 40 MG tablet Take 1 tablet (40 mg total) by mouth daily. 11/13/20   Bensimhon, Bevelyn Buckles, MD  ibuprofen (ADVIL) 200 MG tablet Take 800 mg by mouth every 8 (eight) hours as needed (headache).    [provider]  macitentan (OPSUMIT) 10 MG tablet Take 1 tablet (10 mg total) by mouth daily. 07/22/22   Bensimhon, Bevelyn Buckles, MD  Selexipag (UPTRAVI) 200 MCG TABS Take 1 tablet (200 mcg total) by mouth 2 (two) times daily. 05/26/22   Laurey Morale, MD  sildenafil (REVATIO) 20 MG tablet Take 4 tablets (80 mg total) by mouth 3 (three) times daily. 06/04/22   Bensimhon, Bevelyn Buckles, MD  spironolactone (ALDACTONE) 25 MG tablet Take 1 tablet by mouth once daily 07/13/22   Bensimhon, Bevelyn Buckles, MD  testosterone cypionate (DEPOTESTOSTERONE CYPIONATE) 200 MG/ML injection 200 mg every 14 (fourteen) days. 09/08/21   [provider]  tirzepatide Greggory Keen) 15 MG/0.5ML Pen Inject 15 mg into the skin once a week. Dose increased from 12.5 mg weekly to 15 mg weekly 09/03/22   Bensimhon, Bevelyn Buckles, MD  Vitamin D, Ergocalciferol, (DRISDOL) 1.25 MG (50000 UNIT) CAPS capsule Take 50,000 Units by mouth once a week. Monday 03/14/20   [provider]     Allergies:  Allergies  Allergen Reactions   Jardiance [Empagliflozin] Rash    Social History:   Social History   Socioeconomic History   Marital status: Married    Spouse name: Not on file   Number of children: Not on file   Years of education: Not on file   Highest education level: Not on file  Occupational History   Not on file  Tobacco Use   Smoking status: Former   Smokeless tobacco: Former  Scientific laboratory technician Use: Never used  Substance and Sexual Activity   Alcohol use: Not Currently   Drug use: Not on file   Sexual  activity: Not on file  Other Topics Concern   Not on file  Social History Narrative   Not on file   Social Determinants of Health   Financial Resource Strain: Not on file  Food Insecurity: Not on file  Transportation Needs: Not on file  Physical Activity: Not on file  Stress: Not on file  Social Connections: Not on file  Intimate Partner Violence: Not on file    Family History:  *** The patient's family history is not on file.    ROS:  Please see the history of present illness.  ***All other ROS reviewed and negative.     Physical Exam/Data:  There were no vitals filed for this visit. No intake or output data in the 24 hours ending 10/02/22 2050    09/07/2022    9:10 AM 06/04/2022    9:51 AM 02/09/2022    8:37 AM  Last 3 Weights  Weight (lbs) 313 lb 9.6 oz 322 lb 3.2 oz 323 lb 12.8 oz  Weight (kg) 142.248 kg 146.149 kg 146.875 kg     There is no height or weight on file to calculate BMI.  General:  Well nourished, well developed, in no acute distress*** HEENT: normal Neck: no*** JVD Vascular: No carotid bruits; Distal pulses 2+ bilaterally   Cardiac:  normal S1, S2; RRR; no murmur *** Lungs:  clear to auscultation bilaterally, no wheezing, rhonchi or rales  Abd: soft, nontender, no hepatomegaly  Ext: no*** edema Musculoskeletal:  No deformities, BUE and BLE strength normal and equal Skin: warm and dry  Neuro:  CNs 2-12 intact, no focal abnormalities noted Psych:  Normal affect    EKG:  The ECG that was done *** was personally reviewed and demonstrates ***  Relevant CV Studies:  TTE 06/18/22:  IMPRESSIONS     1. Left ventricular ejection fraction, by estimation, is 65 to 70%. The  left ventricle has normal function. The left ventricle has no regional  wall motion abnormalities. Left ventricular diastolic parameters are  consistent with Grade I diastolic  dysfunction (impaired relaxation). There is the interventricular septum is  flattened in systole and  diastole, consistent with right ventricular  pressure and volume overload.   2. Right ventricular systolic function is moderately reduced. The right  ventricular size is severely enlarged. There is severely elevated  pulmonary artery systolic pressure.   3. Left atrial size was mildly dilated.   4. Right atrial size was severely dilated.   5. The mitral valve is normal in structure. No evidence of mitral valve  regurgitation. No evidence of mitral stenosis.   6. The aortic valve is tricuspid. Aortic valve regurgitation is trivial.  No aortic stenosis is present.   7. The inferior vena cava is dilated in size with <50% respiratory  variability, suggesting right atrial pressure of 15 mmHg.  RHC 10/07/21:  Findings:   RA = 8 RV = 170/18 PA = 104/32 (56) PCW = 6 Fick cardiac output/index = 6.3/2.6 PVR = 7.9 WU PAPi = 9.0 Ao sat = 97% PA sat = 67%, 72% SVC sat = 70%   Assessment: Severe PAH despite triple therapy  LHC/RHC 05/16/2020:  Findings:   Ao =95/67 (80) LV = 100/12 RA = 17 RV = 88/23 PA = 90/39 (58) PCW = 8 Fick cardiac output/index = 4.5/1.7 Thermo CO/CI = 6.0/2.3 PVR = 8.2 WU FA sat = 97% PA sat = 59%, 61%   Assessment: 1. Normal coronary arteries with left dominant system and separate LAD and LCX ostia 2. LVEF 65% 3. Severe PAH  Laboratory Data:  High Sensitivity Troponin:  No results for input(s): "TROPONINIHS" in the last 720 hours.    ChemistryNo results for input(s): "NA", "K", "CL", "CO2", "GLUCOSE", "BUN", "CREATININE", "CALCIUM", "MG", "GFRNONAA", "GFRAA", "ANIONGAP" in the last 168 hours.  No results for input(s): "PROT", "ALBUMIN", "AST", "ALT", "ALKPHOS", "BILITOT" in the last 168 hours. Lipids No results for input(s): "CHOL", "TRIG", "HDL", "LABVLDL", "LDLCALC", "CHOLHDL" in the last 168 hours. HematologyNo results for input(s): "WBC", "RBC", "HGB", "HCT", "MCV", "MCH", "MCHC", "RDW", "PLT" in the last 168 hours. Thyroid No results for  input(s): "TSH", "FREET4" in the last 168 hours. BNPNo results for input(s): "BNP", "PROBNP" in the last 168 hours.  DDimer No results for input(s): "DDIMER" in the last 168 hours.   Radiology/Studies:  No results found.   Assessment and Plan:   #Syncope -TTE -RHC -Telemetry***   Risk Assessment/Risk Scores:  {Complete the following score calculators/questions to meet required metrics.  Press F2:1}  {Is the patient being seen for unstable angina, ACS, NSTEMI or STEMI?:[id number]} {Does this patient have CHF or CHF symptoms?      :[id number]} {Does this patient have ATRIAL FIBRILLATION?:[phone]}   Severity of Illness: {Observation/Inpatient:21159}   For questions or updates, please contact  [facility] Please consult [email or web address] for contact info under     Signed, Karl Ito, MD  10/02/2022 8:50 PM

## 2022-10-04 DIAGNOSIS — J449 Chronic obstructive pulmonary disease, unspecified: Secondary | ICD-10-CM

## 2022-10-04 DIAGNOSIS — I2721 Secondary pulmonary arterial hypertension: Secondary | ICD-10-CM

## 2022-10-04 DIAGNOSIS — R55 Syncope and collapse: Secondary | ICD-10-CM

## 2022-10-04 DIAGNOSIS — E119 Type 2 diabetes mellitus without complications: Secondary | ICD-10-CM

## 2022-10-05 ENCOUNTER — Telehealth: Payer: Self-pay

## 2022-10-05 DIAGNOSIS — R55 Syncope and collapse: Secondary | ICD-10-CM

## 2022-10-05 NOTE — Telephone Encounter (Signed)
Per Dr. Kirtland Bouchard said patient needs zio patch for 14 days for syncope. I called and LM to return my call.

## 2022-10-05 NOTE — Telephone Encounter (Signed)
-----   Message from Noe Gens sent at 10/05/2022  8:23 AM EST ----- Regarding: [facility] ED f/u Pt said he was at [facility] ED all weekend and was told need f/u with Dr. Kirtland Bouchard as NP. No referral yet   Thank you

## 2022-10-05 NOTE — Telephone Encounter (Signed)
Pt is returning your call

## 2022-10-05 NOTE — Addendum Note (Signed)
Addended by: Heywood Bene on: 10/05/2022 11:40 AM   Modules accepted: Orders

## 2022-10-05 NOTE — Telephone Encounter (Signed)
Appt scheduled

## 2022-10-06 ENCOUNTER — Ambulatory Visit: Payer: 59 | Attending: Cardiology

## 2022-10-06 ENCOUNTER — Telehealth: Payer: Self-pay

## 2022-10-06 DIAGNOSIS — R55 Syncope and collapse: Secondary | ICD-10-CM

## 2022-10-06 NOTE — Telephone Encounter (Signed)
Spoke with pt -per Dr. Bing Matter pt should follow up with Dr. Gala Romney as they had talked and decided this would be the best plan for the pt. Pt notified. He will make appt with Dr. Gala Romney for follow up.

## 2022-10-06 NOTE — Progress Notes (Unsigned)
   Nurse Visit   Date of Encounter: 10/06/2022 ID: Bradley English, DOB 1966-08-25, MRN [id number]  PCP:  Ronnald Collum   [facility] Providers Cardiologist:  None { Click to update primary MD,subspecialty MD or APP then REFRESH:1}     Visit Details   VS:  There were no vitals taken for this visit. , BMI There is no height or weight on file to calculate BMI.  Wt Readings from Last 3 Encounters:  09/07/22 (!) 313 lb 9.6 oz (142.2 kg)  06/04/22 (!) 322 lb 3.2 oz (146.1 kg)  02/09/22 (!) 323 lb 12.8 oz (146.9 kg)     Reason for visit: Zio 14 day per Dr. Bing Matter Performed today:  Zio 14 day monitor applied, Vitals, and Education Changes (medications, testing, etc.) : No changes Length of Visit: 15 minutes    Medications Adjustments/Labs and Tests Ordered: No orders of the defined types were placed in this encounter.  No orders of the defined types were placed in this encounter.    Signed, Neena Rhymes, RN  10/06/2022 9:15 AM

## 2022-10-14 ENCOUNTER — Telehealth (HOSPITAL_COMMUNITY): Payer: Self-pay | Admitting: Internal Medicine

## 2022-10-14 NOTE — Telephone Encounter (Signed)
[facility] lvm w/triage requesting sooner appt, please advise

## 2022-10-15 NOTE — Telephone Encounter (Signed)
Not sure why sooner appt is requested, pt had syncope which was addressed by Dr Bing Matter and zio was ordered. Attempted to call pt for more info and Left message to call back

## 2022-10-16 NOTE — Telephone Encounter (Signed)
Attempted to reach patient and left message to call back

## 2022-11-02 ENCOUNTER — Ambulatory Visit: Payer: 59 | Admitting: Cardiology

## 2022-11-11 ENCOUNTER — Telehealth: Payer: Self-pay

## 2022-11-11 NOTE — Telephone Encounter (Signed)
LVM and My Chart message per DPR- Per Dr. Krasowski's note. Encouraged pt call with any questions. Routed to PCP. 

## 2022-11-24 ENCOUNTER — Other Ambulatory Visit (HOSPITAL_COMMUNITY): Payer: Self-pay

## 2022-11-24 DIAGNOSIS — I5022 Chronic systolic (congestive) heart failure: Secondary | ICD-10-CM

## 2022-11-24 MED ORDER — MACITENTAN 10 MG PO TABS: 10.0000 mg | ORAL_TABLET | Freq: Every day | ORAL | 7 refills | Status: DC

## 2022-12-09 ENCOUNTER — Other Ambulatory Visit (HOSPITAL_COMMUNITY): Payer: Self-pay

## 2022-12-09 ENCOUNTER — Encounter (HOSPITAL_COMMUNITY): Payer: Self-pay | Admitting: Internal Medicine

## 2022-12-09 ENCOUNTER — Ambulatory Visit (HOSPITAL_COMMUNITY)
Admission: RE | Admit: 2022-12-09 | Discharge: 2022-12-09 | Disposition: A | Discharge status: Home / Self Care | Payer: 59 | Source: Ambulatory Visit | Attending: Internal Medicine | Admitting: Internal Medicine

## 2022-12-09 VITALS — BP 100/60 | HR 70 | Wt 301.0 lb

## 2022-12-09 DIAGNOSIS — I272 Pulmonary hypertension, unspecified: Secondary | ICD-10-CM | POA: Diagnosis not present

## 2022-12-09 DIAGNOSIS — M79606 Pain in leg, unspecified: Secondary | ICD-10-CM | POA: Diagnosis not present

## 2022-12-09 DIAGNOSIS — R55 Syncope and collapse: Secondary | ICD-10-CM | POA: Insufficient documentation

## 2022-12-09 DIAGNOSIS — E119 Type 2 diabetes mellitus without complications: Secondary | ICD-10-CM | POA: Diagnosis not present

## 2022-12-09 DIAGNOSIS — Z87891 Personal history of nicotine dependence: Secondary | ICD-10-CM | POA: Insufficient documentation

## 2022-12-09 DIAGNOSIS — I110 Hypertensive heart disease with heart failure: Secondary | ICD-10-CM | POA: Insufficient documentation

## 2022-12-09 DIAGNOSIS — J449 Chronic obstructive pulmonary disease, unspecified: Secondary | ICD-10-CM | POA: Insufficient documentation

## 2022-12-09 DIAGNOSIS — I5022 Chronic systolic (congestive) heart failure: Secondary | ICD-10-CM

## 2022-12-09 DIAGNOSIS — G4733 Obstructive sleep apnea (adult) (pediatric): Secondary | ICD-10-CM | POA: Diagnosis not present

## 2022-12-09 DIAGNOSIS — Z79899 Other long term (current) drug therapy: Secondary | ICD-10-CM | POA: Diagnosis not present

## 2022-12-09 DIAGNOSIS — I5032 Chronic diastolic (congestive) heart failure: Secondary | ICD-10-CM

## 2022-12-09 LAB — BASIC METABOLIC PANEL
Anion gap: 10 (ref 5–15)
BUN: 28 mg/dL — ABNORMAL HIGH (ref 6–20)
CO2: 22 mmol/L (ref 22–32)
Calcium: 9.3 mg/dL (ref 8.9–10.3)
Chloride: 106 mmol/L (ref 98–111)
Creatinine, Ser: 1.62 mg/dL — ABNORMAL HIGH (ref 0.61–1.24)
GFR, Estimated: 50 mL/min — ABNORMAL LOW (ref >60)
Glucose, Bld: 116 mg/dL — ABNORMAL HIGH (ref 70–99)
Potassium: 4 mmol/L (ref 3.5–5.1)
Sodium: 138 mmol/L (ref 135–145)

## 2022-12-09 LAB — CBC
HCT: 44.6 % (ref 39.0–52.0)
Hemoglobin: 15.2 g/dL (ref 13.0–17.0)
MCH: 30.8 pg (ref 26.0–34.0)
MCHC: 34.1 g/dL (ref 30.0–36.0)
MCV: 90.5 fL (ref 80.0–100.0)
Platelets: 197 10*3/uL (ref 150–400)
RBC: 4.93 MIL/uL (ref 4.22–5.81)
RDW: 13.2 % (ref 11.5–15.5)
WBC: 7.3 10*3/uL (ref 4.0–10.5)
nRBC: 0 % (ref 0.0–0.2)

## 2022-12-09 LAB — BRAIN NATRIURETIC PEPTIDE: B Natriuretic Peptide: 316.3 pg/mL — ABNORMAL HIGH (ref 0.0–100.0)

## 2022-12-09 NOTE — Progress Notes (Signed)
ADVANCED HF CLINIC NOTE PCP: Secundino Ginger, PA-C Pulmonary: Dr. Glade Lloyd HF Cardiologist: Dr. Haroldine Laws  HPI: Bradley English is a 57 y.o.male with HTN, morbid obesity, COPD, severe OSA, DM2, former tobacco use referred by Roque Cash PA-C for further evaluation of pulmonary HTN   Smoked 1.5 ppd x 30 years quit 1/21   Has worked in Barrister's clerk with re-upholstering. In 10/20 started to notice more LE swelling and SOB. Went to ER at [facility]. Had chest CT (negative for PE) and LE u/s negative.   Echo 7/21: LVEF 55-60% RV severely dilated with moderately decreased function. Flattened septum   We saw him for the first time in 7/21 with Class IIIB symptoms and underwent R/L cath. 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 Sildenafil started and felt like it really helped.   Echo 10/25/20: LVEF 65% RV markedly dilated and hypokinetic D-shaped septum.   RHC 5/22  RA = 11 RV = 101/14 PA = 100/34 (58) PCW = 17 Fick cardiac output/index = 9.0/3.5 PVR = 4.6 WU Ao sat = 93% PA sat = 69%, 70%  Follow up 12/22, NYHA II, volume ok. Tolerating selexipeg 200 bid well, 400 bid caused severe leg pain.   Echo 10/03/21: EF 60-65% RV dilated severely HK. Septum flat. RVSP ~ 80   RHC 12/22 showed severe PAH despite triple therapy. Sildenafil increased to 60 tid. Consider switch to Adempas.    RA = 8 RV = 107/18 PA = 104/32 (56) PCW = 6 Fick cardiac output/index = 6.3/2.6 PVR = 7.9 WU PAPi = 9.0 Ao sat = 97% PA sat = 67%, 72% SVC sat = 70%  Echo 8/23: LVEF 65-70%RV moderately reduced Mild TR RVSP 118  Today he returns for HF follow up. Had syncopal episode on 09/18/22. Was coming in from grocery store with bags in his hand and was very angry with one of his children. Felt very dizzy then he passed out. Son said he turned blue and stopped breathing and began chest compressions. He then came to and was taken to ER. Admitted to [facility] x 3 days. CT chest ok.  Stable on monitor.   Zio patch 1/24 with two runs of SVT - longest run 9 beats  Ancillary data:   - 6MW 1/22 243 m (800 feet)  - 6MW 9/22: 426 m (1400 feet) - 6MW 8/23: 463m(1350 feet)  - PFTs 11/20 FEV1 1.93 (57% FVC 2.66 (64%) FEF 25-75% 35% DLCO 63%  Current Outpatient Medications  Medication Sig Dispense Refill   aspirin EC 81 MG tablet Take 81 mg by mouth in the morning. Swallow whole.     atorvastatin (LIPITOR) 10 MG tablet Take 10 mg by mouth at bedtime.      carvedilol (COREG) 12.5 MG tablet Take 12.5 mg by mouth 2 (two) times daily.     furosemide (LASIX) 40 MG tablet Take 1 tablet (40 mg total) by mouth daily. 30 tablet 3   ibuprofen (ADVIL) 200 MG tablet Take 800 mg by mouth every 8 (eight) hours as needed (headache).     macitentan (OPSUMIT) 10 MG tablet Take 1 tablet (10 mg total) by mouth daily. 30 tablet 7   Selexipag (UPTRAVI) 200 MCG TABS Take 1 tablet (200 mcg total) by mouth 2 (two) times daily. 60 tablet 11   sildenafil (REVATIO) 20 MG tablet Take 4 tablets (80 mg total) by mouth 3 (three) times daily. 360 tablet 11   spironolactone (ALDACTONE)  25 MG tablet Take 1 tablet by mouth once daily 90 tablet 3   testosterone cypionate (DEPOTESTOSTERONE CYPIONATE) 200 MG/ML injection 200 mg every 14 (fourteen) days.     tirzepatide (MOUNJARO) 15 MG/0.5ML Pen Inject 15 mg into the skin once a week. Dose increased from 12.5 mg weekly to 15 mg weekly 6 mL 3   Vitamin D, Ergocalciferol, (DRISDOL) 1.25 MG (50000 UNIT) CAPS capsule Take 50,000 Units by mouth once a week. Monday     No current facility-administered medications for this encounter.   Allergies  Allergen Reactions   Jardiance [Empagliflozin] Rash   Social History   Socioeconomic History   Marital status: Married    Spouse name: Not on file   Number of children: Not on file   Years of education: Not on file   Highest education level: Not on file  Occupational History   Not on file  Tobacco Use    Smoking status: Former   Smokeless tobacco: Former  Scientific laboratory technician Use: Never used  Substance and Sexual Activity   Alcohol use: Not Currently   Drug use: Not on file   Sexual activity: Not on file  Other Topics Concern   Not on file  Social History Narrative   Not on file   Social Determinants of Health   Financial Resource Strain: Not on file  Food Insecurity: Not on file  Transportation Needs: Not on file  Physical Activity: Not on file  Stress: Not on file  Social Connections: Not on file  Intimate Partner Violence: Not on file   FHx: M died to cancer D died due to MVA PGF died to HF Brother stage IV kidney CA Sister died due to ETOH cirrhosis  BP 100/60   Pulse 70   Wt (!) 136.5 kg (301 lb)   SpO2 95%   BMI 45.77 kg/m   Wt Readings from Last 3 Encounters:  12/09/22 (!) 136.5 kg (301 lb)  10/06/22 (!) 136.5 kg (301 lb)  09/07/22 (!) 142.2 kg (313 lb 9.6 oz)   PHYSICAL EXAM: General:  Well appearing. No resp difficulty HEENT: normal Neck: supple. JVP hard to see  Carotids 2+ bilat; no bruits. No lymphadenopathy or thryomegaly appreciated. Cor: PMI nondisplaced. Regular rate & rhythm. 2/6 TR Lungs: clear Abdomen: obese soft, nontender, nondistended. No hepatosplenomegaly. No bruits or masses. Good bowel sounds. Extremities: no cyanosis, clubbing, rash, edema Neuro: alert & orientedx3, cranial nerves grossly intact. moves all 4 extremities w/o difficulty. Affect pleasant   ASSESSMENT & PLAN:  1. Pulmonary HTN/cor pulmonale - suspect mostly WHO Group III but may  Have component of WHO Group 2 +/- anorexigen meds (Phenteramine). - Suspect WHO group I & III. - CT chest and LE dopplers negative for DVT/PE - Has severe OSA -> continue CPAP. - PFTs reviewed and reflect moderate restriction/obstruction. - VQ 5/23 negative - RHC 7/21 with severe PAH. PA 90/39 (58) PCWP 8 PVR 8.2 WU.  - Echo 10/25/20: EF 60-65%, RV markedly dilated and HK RV flattened septum,  markedly dilated - Repeat RHC 5/22: PA = 100/34 (58) PCW = 17 Fick cardiac output/index = 9.0/3.5 PVR = 4.6 WU - 6MW 1/22 239m(800 feet)  - 6MW 9/22 426 m (1400 ft) - Echo 10/03/21: EF 60-65% RV dilated severely HK. Septum flat. RVSP ~ 80. - Repeat RHC 12/22 showed severe PAH despite triple therapy. - Echo 8/23: LVEF 65-70%RV moderately reduced Mild TR RVSP 118 - PA pressures remain markedly elevated. Symptoms  progressing now NYHA III-IIB with syncope/possible cardiac arrest despite triple therapy - I think it is time for IV therapies. I have d/w Dr. Daine Gravel at [facility]. Will do R/L heart cath and refer for evaluation (patient wants to wait until 4/24 to start) - Continue sildenafil 80 tid - Continue macitentan 10 mg daily and selex 200 bid (selex 400 bid caused severe leg pain). - Consider switch to Adempas. - Continue Lasix 40 mg daily. - Continue spiro 25 mg daily.  2. Morbid obesity - Continue weight loss efforts including GLP1RA  3. Severe OSA - Very compliant with CPAP.  Total time spent 45 minutes. Over half that time spent discussing above.    Bradley Bickers, MD  3:40 PM

## 2022-12-09 NOTE — Patient Instructions (Addendum)
There has been no changes to your medications   Labs done today, your results will be available in MyChart, we will contact you for abnormal readings.  Your Referral Package has been sent to [facility]. They will call you to arrange your admission.   You are scheduled for a Cardiac Catheterization on Friday, February 23 with Dr. Glori Bickers.  1. Please arrive at the [facility] (Main Entrance A) at [facility]: [address] [place], [place] [address] at 2:00 PM (This time is two hours before your procedure to ensure your preparation). Free valet parking service is available.   Special note: Every effort is made to have your procedure done on time. Please understand that emergencies sometimes delay scheduled procedures.  2. Diet: Do not eat solid foods after midnight.  The patient may have clear liquids until 5am upon the day of the procedure.  3. Medication instructions in preparation for your procedure:   Contrast Allergy: No  HOLD Spironolactone and Lasix the morning of procedure   On the morning of your procedure, take your Aspirin 81 mg and any morning medicines NOT listed above.  You may use sips of water.  5. Plan for one night stay--bring personal belongings. 6. Bring a current list of your medications and current insurance cards. 7. You MUST have a responsible person to drive you home. 8. Someone MUST be with you the first 24 hours after you arrive home or your discharge will be delayed. 9. Please wear clothes that are easy to get on and off and wear slip-on shoes.   Your physician recommends that you schedule a follow-up appointment in: 3 months ( May) ** please call the office in April to arrange your follow up appointment.**  If you have any questions or concerns before your next appointment please send [place] a message through [place] or call our office at [phone].    TO LEAVE A MESSAGE FOR THE NURSE SELECT OPTION 2, PLEASE LEAVE A MESSAGE INCLUDING: YOUR  NAME DATE OF BIRTH CALL BACK NUMBER REASON FOR CALL**this is important as we prioritize the call backs  YOU WILL RECEIVE A CALL BACK THE SAME DAY AS LONG AS YOU CALL BEFORE 4:00 PM  At the [facility], you and your health needs are our priority. As part of our continuing mission to provide you with exceptional heart care, we have created designated Provider Care Teams. These Care Teams include your primary Cardiologist (physician) and Advanced Practice Providers (APPs- Physician Assistants and Nurse Practitioners) who all work together to provide you with the care you need, when you need it.   You may see any of the following providers on your designated Care Team at your next follow up: Dr Glori Bickers Dr Loralie Champagne Dr. Roxana Hires, NP Lyda Jester, [place] [facility] [place], [place] Forestine Na, NP Audry Riles, PharmD   Please be sure to bring in all your medications bottles to every appointment.    Thank you for choosing [facility]

## 2022-12-09 NOTE — H&P (View-Only) (Signed)
ADVANCED HF CLINIC NOTE PCP: Secundino Ginger, PA-C Pulmonary: Dr. Glade Lloyd HF Cardiologist: Dr. Haroldine Laws  HPI: [name] is a 57 y.o.male with HTN, morbid obesity, COPD, severe OSA, DM2, former tobacco use referred by Roque Cash PA-C for further evaluation of pulmonary HTN   Smoked 1.5 ppd x 30 years quit 1/21   Has worked in Barrister's clerk with re-upholstering. In 10/20 started to notice more LE swelling and SOB. Went to ER at [facility]. Had chest CT (negative for PE) and LE u/s negative.   Echo 7/21: LVEF 55-60% RV severely dilated with moderately decreased function. Flattened septum   We saw him for the first time in 7/21 with Class IIIB symptoms and underwent R/L cath. 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 Sildenafil started and felt like it really helped.   Echo 10/25/20: LVEF 65% RV markedly dilated and hypokinetic D-shaped septum.   RHC 5/22  RA = 11 RV = 101/14 PA = 100/34 (58) PCW = 17 Fick cardiac output/index = 9.0/3.5 PVR = 4.6 WU Ao sat = 93% PA sat = 69%, 70%  Follow up 12/22, NYHA II, volume ok. Tolerating selexipeg 200 bid well, 400 bid caused severe leg pain.   Echo 10/03/21: EF 60-65% RV dilated severely HK. Septum flat. RVSP ~ 80   RHC 12/22 showed severe PAH despite triple therapy. Sildenafil increased to 60 tid. Consider switch to Adempas.    RA = 8 RV = 107/18 PA = 104/32 (56) PCW = 6 Fick cardiac output/index = 6.3/2.6 PVR = 7.9 WU PAPi = 9.0 Ao sat = 97% PA sat = 67%, 72% SVC sat = 70%  Echo 8/23: LVEF 65-70%RV moderately reduced Mild TR RVSP 118  Today he returns for HF follow up. Had syncopal episode on 09/18/22. Was coming in from grocery store with bags in his hand and was very angry with one of his children. Felt very dizzy then he passed out. Son said he turned blue and stopped breathing and began chest compressions. He then came to and was taken to ER. Admitted to [facility] x 3 days. CT chest ok.  Stable on monitor.   Zio patch 1/24 with two runs of SVT - longest run 9 beats  Ancillary data:   - 6MW 1/22 243 m (800 feet)  - 6MW 9/22: 426 m (1400 feet) - 6MW 8/23: 473m(1350 feet)  - PFTs 11/20 FEV1 1.93 (57% FVC 2.66 (64%) FEF 25-75% 35% DLCO 63%  Current Outpatient Medications  Medication Sig Dispense Refill   aspirin EC 81 MG tablet Take 81 mg by mouth in the morning. Swallow whole.     atorvastatin (LIPITOR) 10 MG tablet Take 10 mg by mouth at bedtime.      carvedilol (COREG) 12.5 MG tablet Take 12.5 mg by mouth 2 (two) times daily.     furosemide (LASIX) 40 MG tablet Take 1 tablet (40 mg total) by mouth daily. 30 tablet 3   ibuprofen (ADVIL) 200 MG tablet Take 800 mg by mouth every 8 (eight) hours as needed (headache).     macitentan (OPSUMIT) 10 MG tablet Take 1 tablet (10 mg total) by mouth daily. 30 tablet 7   Selexipag (UPTRAVI) 200 MCG TABS Take 1 tablet (200 mcg total) by mouth 2 (two) times daily. 60 tablet 11   sildenafil (REVATIO) 20 MG tablet Take 4 tablets (80 mg total) by mouth 3 (three) times daily. 360 tablet 11   spironolactone (ALDACTONE)  25 MG tablet Take 1 tablet by mouth once daily 90 tablet 3   testosterone cypionate (DEPOTESTOSTERONE CYPIONATE) 200 MG/ML injection 200 mg every 14 (fourteen) days.     tirzepatide (MOUNJARO) 15 MG/0.5ML Pen Inject 15 mg into the skin once a week. Dose increased from 12.5 mg weekly to 15 mg weekly 6 mL 3   Vitamin D, Ergocalciferol, (DRISDOL) 1.25 MG (50000 UNIT) CAPS capsule Take 50,000 Units by mouth once a week. Monday     No current facility-administered medications for this encounter.   Allergies  Allergen Reactions   Jardiance [Empagliflozin] Rash   Social History   Socioeconomic History   Marital status: Married    Spouse name: Not on file   Number of children: Not on file   Years of education: Not on file   Highest education level: Not on file  Occupational History   Not on file  Tobacco Use    Smoking status: Former   Smokeless tobacco: Former  Scientific laboratory technician Use: Never used  Substance and Sexual Activity   Alcohol use: Not Currently   Drug use: Not on file   Sexual activity: Not on file  Other Topics Concern   Not on file  Social History Narrative   Not on file   Social Determinants of Health   Financial Resource Strain: Not on file  Food Insecurity: Not on file  Transportation Needs: Not on file  Physical Activity: Not on file  Stress: Not on file  Social Connections: Not on file  Intimate Partner Violence: Not on file   FHx: M died to cancer D died due to MVA PGF died to HF Brother stage IV kidney CA Sister died due to ETOH cirrhosis  BP 100/60   Pulse 70   Wt (!) 136.5 kg (301 lb)   SpO2 95%   BMI 45.77 kg/m   Wt Readings from Last 3 Encounters:  12/09/22 (!) 136.5 kg (301 lb)  10/06/22 (!) 136.5 kg (301 lb)  09/07/22 (!) 142.2 kg (313 lb 9.6 oz)   PHYSICAL EXAM: General:  Well appearing. No resp difficulty HEENT: normal Neck: supple. JVP hard to see  Carotids 2+ bilat; no bruits. No lymphadenopathy or thryomegaly appreciated. Cor: PMI nondisplaced. Regular rate & rhythm. 2/6 TR Lungs: clear Abdomen: obese soft, nontender, nondistended. No hepatosplenomegaly. No bruits or masses. Good bowel sounds. Extremities: no cyanosis, clubbing, rash, edema Neuro: alert & orientedx3, cranial nerves grossly intact. moves all 4 extremities w/o difficulty. Affect pleasant   ASSESSMENT & PLAN:  1. Pulmonary HTN/cor pulmonale - suspect mostly WHO Group III but may  Have component of WHO Group 2 +/- anorexigen meds (Phenteramine). - Suspect WHO group I & III. - CT chest and LE dopplers negative for DVT/PE - Has severe OSA -> continue CPAP. - PFTs reviewed and reflect moderate restriction/obstruction. - VQ 5/23 negative - RHC 7/21 with severe PAH. PA 90/39 (58) PCWP 8 PVR 8.2 WU.  - Echo 10/25/20: EF 60-65%, RV markedly dilated and HK RV flattened septum,  markedly dilated - Repeat RHC 5/22: PA = 100/34 (58) PCW = 17 Fick cardiac output/index = 9.0/3.5 PVR = 4.6 WU - 6MW 1/22 260m(800 feet)  - 6MW 9/22 426 m (1400 ft) - Echo 10/03/21: EF 60-65% RV dilated severely HK. Septum flat. RVSP ~ 80. - Repeat RHC 12/22 showed severe PAH despite triple therapy. - Echo 8/23: LVEF 65-70%RV moderately reduced Mild TR RVSP 118 - PA pressures remain markedly elevated. Symptoms  progressing now NYHA III-IIB with syncope/possible cardiac arrest despite triple therapy - I think it is time for IV therapies. I have d/w Dr. Daine Gravel at [facility]. Will do R/L heart cath and refer for evaluation (patient wants to wait until 4/24 to start) - Continue sildenafil 80 tid - Continue macitentan 10 mg daily and selex 200 bid (selex 400 bid caused severe leg pain). - Consider switch to Adempas. - Continue Lasix 40 mg daily. - Continue spiro 25 mg daily.  2. Morbid obesity - Continue weight loss efforts including GLP1RA  3. Severe OSA - Very compliant with CPAP.  Total time spent 45 minutes. Over half that time spent discussing above.    Glori Bickers, MD  3:40 PM

## 2022-12-11 ENCOUNTER — Encounter (HOSPITAL_COMMUNITY)
Admission: RE | Disposition: A | Discharge status: Home / Self Care | Payer: Self-pay | Source: Home / Self Care | Attending: Internal Medicine

## 2022-12-11 ENCOUNTER — Other Ambulatory Visit: Payer: Self-pay

## 2022-12-11 ENCOUNTER — Ambulatory Visit (HOSPITAL_COMMUNITY)
Admission: RE | Admit: 2022-12-11 | Discharge: 2022-12-11 | Disposition: A | Discharge status: Home / Self Care | Payer: 59 | Attending: Internal Medicine | Admitting: Internal Medicine

## 2022-12-11 DIAGNOSIS — G4733 Obstructive sleep apnea (adult) (pediatric): Secondary | ICD-10-CM | POA: Diagnosis not present

## 2022-12-11 DIAGNOSIS — Z6841 Body Mass Index (BMI) 40.0 and over, adult: Secondary | ICD-10-CM | POA: Insufficient documentation

## 2022-12-11 DIAGNOSIS — I2721 Secondary pulmonary arterial hypertension: Secondary | ICD-10-CM | POA: Diagnosis present

## 2022-12-11 DIAGNOSIS — I2510 Atherosclerotic heart disease of native coronary artery without angina pectoris: Secondary | ICD-10-CM | POA: Insufficient documentation

## 2022-12-11 DIAGNOSIS — I5022 Chronic systolic (congestive) heart failure: Secondary | ICD-10-CM

## 2022-12-11 DIAGNOSIS — Z87891 Personal history of nicotine dependence: Secondary | ICD-10-CM | POA: Diagnosis not present

## 2022-12-11 DIAGNOSIS — Z79899 Other long term (current) drug therapy: Secondary | ICD-10-CM | POA: Insufficient documentation

## 2022-12-11 DIAGNOSIS — E119 Type 2 diabetes mellitus without complications: Secondary | ICD-10-CM | POA: Insufficient documentation

## 2022-12-11 DIAGNOSIS — J449 Chronic obstructive pulmonary disease, unspecified: Secondary | ICD-10-CM | POA: Diagnosis not present

## 2022-12-11 HISTORY — PX: RIGHT/LEFT HEART CATH AND CORONARY ANGIOGRAPHY: CATH118266

## 2022-12-11 LAB — POCT I-STAT EG7
Acid-base deficit: 2 mmol/L (ref 0.0–2.0)
Acid-base deficit: 2 mmol/L (ref 0.0–2.0)
Acid-base deficit: 4 mmol/L — ABNORMAL HIGH (ref 0.0–2.0)
Bicarbonate: 19.4 mmol/L — ABNORMAL LOW (ref 20.0–28.0)
Bicarbonate: 22.5 mmol/L (ref 20.0–28.0)
Bicarbonate: 23.2 mmol/L (ref 20.0–28.0)
Calcium, Ion: 1.16 mmol/L (ref 1.15–1.40)
Calcium, Ion: 1.29 mmol/L (ref 1.15–1.40)
Calcium, Ion: 1.32 mmol/L (ref 1.15–1.40)
HCT: 41 % (ref 39.0–52.0)
HCT: 42 % (ref 39.0–52.0)
HCT: 44 % (ref 39.0–52.0)
Hemoglobin: 13.9 g/dL (ref 13.0–17.0)
Hemoglobin: 14.3 g/dL (ref 13.0–17.0)
Hemoglobin: 15 g/dL (ref 13.0–17.0)
O2 Saturation: 60 %
O2 Saturation: 64 %
O2 Saturation: 94 %
Potassium: 3.8 mmol/L (ref 3.5–5.1)
Potassium: 3.9 mmol/L (ref 3.5–5.1)
Potassium: 4 mmol/L (ref 3.5–5.1)
Sodium: 139 mmol/L (ref 135–145)
Sodium: 140 mmol/L (ref 135–145)
Sodium: 141 mmol/L (ref 135–145)
TCO2: 20 mmol/L — ABNORMAL LOW (ref 22–32)
TCO2: 24 mmol/L (ref 22–32)
TCO2: 24 mmol/L (ref 22–32)
pCO2, Ven: 31.5 mmHg — ABNORMAL LOW (ref 44–60)
pCO2, Ven: 38.1 mmHg — ABNORMAL LOW (ref 44–60)
pCO2, Ven: 39.1 mmHg — ABNORMAL LOW (ref 44–60)
pH, Ven: 7.379 (ref 7.25–7.43)
pH, Ven: 7.381 (ref 7.25–7.43)
pH, Ven: 7.399 (ref 7.25–7.43)
pO2, Ven: 32 mmHg (ref 32–45)
pO2, Ven: 34 mmHg (ref 32–45)
pO2, Ven: 70 mmHg — ABNORMAL HIGH (ref 32–45)

## 2022-12-11 LAB — GLUCOSE, CAPILLARY: Glucose-Capillary: 121 mg/dL — ABNORMAL HIGH (ref 70–99)

## 2022-12-11 SURGERY — RIGHT/LEFT HEART CATH AND CORONARY ANGIOGRAPHY
Anesthesia: LOCAL

## 2022-12-11 MED ORDER — HEPARIN SODIUM (PORCINE) 1000 UNIT/ML IJ SOLN
INTRAMUSCULAR | Status: AC
  Filled 2022-12-11: qty 10

## 2022-12-11 MED ORDER — SODIUM CHLORIDE 0.9% FLUSH: 3.0000 mL | Freq: Two times a day (BID) | INTRAVENOUS | Status: DC

## 2022-12-11 MED ORDER — SODIUM CHLORIDE 0.9 % IV SOLN: INTRAVENOUS | Status: DC

## 2022-12-11 MED ORDER — HEPARIN (PORCINE) IN NACL 1000-0.9 UT/500ML-% IV SOLN
INTRAVENOUS | Status: DC | PRN
  Administered 2022-12-11 (×2): 500 mL

## 2022-12-11 MED ORDER — LIDOCAINE HCL (PF) 1 % IJ SOLN
INTRAMUSCULAR | Status: DC | PRN
  Administered 2022-12-11 (×2): 2 mL via INTRADERMAL

## 2022-12-11 MED ORDER — LIDOCAINE HCL (PF) 1 % IJ SOLN
INTRAMUSCULAR | Status: AC
  Filled 2022-12-11: qty 30

## 2022-12-11 MED ORDER — SODIUM CHLORIDE 0.9 % IV SOLN: 250.0000 mL | INTRAVENOUS | Status: DC | PRN

## 2022-12-11 MED ORDER — SODIUM CHLORIDE 0.9% FLUSH: 3.0000 mL | INTRAVENOUS | Status: DC | PRN

## 2022-12-11 MED ORDER — IOHEXOL 350 MG/ML SOLN
INTRAVENOUS | Status: DC | PRN
  Administered 2022-12-11: 55 mL

## 2022-12-11 MED ORDER — VERAPAMIL HCL 2.5 MG/ML IV SOLN
INTRAVENOUS | Status: AC
  Filled 2022-12-11: qty 2

## 2022-12-11 MED ORDER — VERAPAMIL HCL 2.5 MG/ML IV SOLN
INTRAVENOUS | Status: DC | PRN
  Administered 2022-12-11: 10 mL via INTRA_ARTERIAL

## 2022-12-11 MED ORDER — HEPARIN SODIUM (PORCINE) 1000 UNIT/ML IJ SOLN
INTRAMUSCULAR | Status: DC | PRN
  Administered 2022-12-11: 6000 [IU] via INTRAVENOUS

## 2022-12-11 SURGICAL SUPPLY — 11 items
BAND ZEPHYR COMPRESS 30 LONG (HEMOSTASIS) IMPLANT
CATH 5FR JL3.5 JR4 ANG PIG MP (CATHETERS) IMPLANT
CATH SWAN GANZ 7F STRAIGHT (CATHETERS) IMPLANT
GLIDESHEATH SLEND SS 6F .021 (SHEATH) IMPLANT
GLIDESHEATH SLENDER 7FR .021G (SHEATH) IMPLANT
GUIDEWIRE .025 260CM (WIRE) IMPLANT
GUIDEWIRE INQWIRE 1.5J.035X260 (WIRE) IMPLANT
INQWIRE 1.5J .035X260CM (WIRE) ×1
PACK CARDIAC CATHETERIZATION (CUSTOM PROCEDURE TRAY) ×1 IMPLANT
SHEATH PINNACLE 7F 10CM (SHEATH) IMPLANT
TRANSDUCER W/STOPCOCK (MISCELLANEOUS) ×1 IMPLANT

## 2022-12-11 NOTE — Interval H&P Note (Signed)
History and Physical Interval Note:  12/11/2022 12:57 PM  Bradley English  has presented today for surgery, with the diagnosis of CHF/PAH.  The various methods of treatment have been discussed with the patient and family. After consideration of risks, benefits and other options for treatment, the patient has consented to  Procedure(s): RIGHT/LEFT HEART CATH AND CORONARY ANGIOGRAPHY (N/A) as a surgical intervention.  The patient's history has been reviewed, patient examined, no change in status, stable for surgery.  I have reviewed the patient's chart and labs.  Questions were answered to the patient's satisfaction.     [name]

## 2022-12-11 NOTE — Discharge Instructions (Signed)

## 2022-12-14 ENCOUNTER — Encounter (HOSPITAL_COMMUNITY): Payer: Self-pay | Admitting: Internal Medicine

## 2023-01-06 ENCOUNTER — Telehealth (HOSPITAL_COMMUNITY): Payer: Self-pay

## 2023-01-06 NOTE — Telephone Encounter (Signed)
Awaiting MD signature 

## 2023-01-11 NOTE — Telephone Encounter (Signed)
Disability forms faxed on 01/07/23, my chart message sent to patient to inform him

## 2023-02-05 ENCOUNTER — Telehealth (HOSPITAL_COMMUNITY): Payer: Self-pay | Admitting: Pharmacy Technician

## 2023-02-05 NOTE — Telephone Encounter (Signed)
Patient Advocate Encounter   Received notification from [employer] that prior authorization for Uptravi (200 mcg) is required.   PA submitted on CoverMyMeds Key [id number] Status is pending   Will continue to follow.

## 2023-02-08 NOTE — Telephone Encounter (Signed)
Advanced Heart Failure Patient Advocate Encounter  Prior Authorization for Uptravi (200 mcg bid) has been approved.    PA# [id number] Effective dates: 02/05/23 through 02/05/24  [name], CPhT

## 2023-02-24 ENCOUNTER — Other Ambulatory Visit (HOSPITAL_COMMUNITY): Payer: Self-pay

## 2023-03-30 ENCOUNTER — Telehealth (HOSPITAL_COMMUNITY): Payer: Self-pay | Admitting: Pharmacy Technician

## 2023-03-30 ENCOUNTER — Other Ambulatory Visit (HOSPITAL_COMMUNITY): Payer: Self-pay

## 2023-03-30 NOTE — Telephone Encounter (Signed)
Advanced Heart Failure Patient Advocate Encounter  Prior Authorization for Bradley English has been approved.    PA# [id number] Effective dates: 03/30/23 through 03/29/24  [name], CPhT

## 2023-03-30 NOTE — Telephone Encounter (Signed)
Advanced Heart Failure Patient Advocate Encounter  Prior Authorization for Opsumit has been approved.    PA# [id number] Effective dates: 03/30/23 through 03/29/24  [name], CPhT

## 2023-03-30 NOTE — Telephone Encounter (Signed)
Patient Advocate Encounter   Received notification from [facility] that prior authorization for Bradley English is required.   PA submitted on CoverMyMeds Key [id number] Status is pending   Will continue to follow.

## 2023-03-30 NOTE — Telephone Encounter (Signed)
Patient Advocate Encounter   Received notification from [facility] that prior authorization for Opsumit is required.   PA submitted on CoverMyMeds Key [id number] Status is pending   Will continue to follow.

## 2023-04-25 ENCOUNTER — Other Ambulatory Visit (HOSPITAL_COMMUNITY): Payer: Self-pay | Admitting: Cardiology

## 2023-05-04 ENCOUNTER — Telehealth (HOSPITAL_COMMUNITY): Payer: Self-pay

## 2023-05-04 NOTE — Telephone Encounter (Signed)
LATE DOCUMENTATION  THIS WAS SUCCESSFULLY FAXED ON 05/03/23  Received a fax requesting medical records from [facility] [employer]. Records were successfully faxed to: [phone] ,which was the number provided.. Medical request form will be scanned into patients chart.

## 2023-07-27 ENCOUNTER — Other Ambulatory Visit (HOSPITAL_COMMUNITY): Payer: Self-pay | Admitting: Internal Medicine

## 2023-07-27 DIAGNOSIS — I5022 Chronic systolic (congestive) heart failure: Secondary | ICD-10-CM

## 2023-10-27 IMAGING — DX DG CHEST 2V
2 series · 2 of 2 positions shown · non-contrast
Comparison: 05/08/2020

CLINICAL DATA: Chronic diastolic heart failure.

EXAM:
CHEST - 2 VIEW

[chest pa]
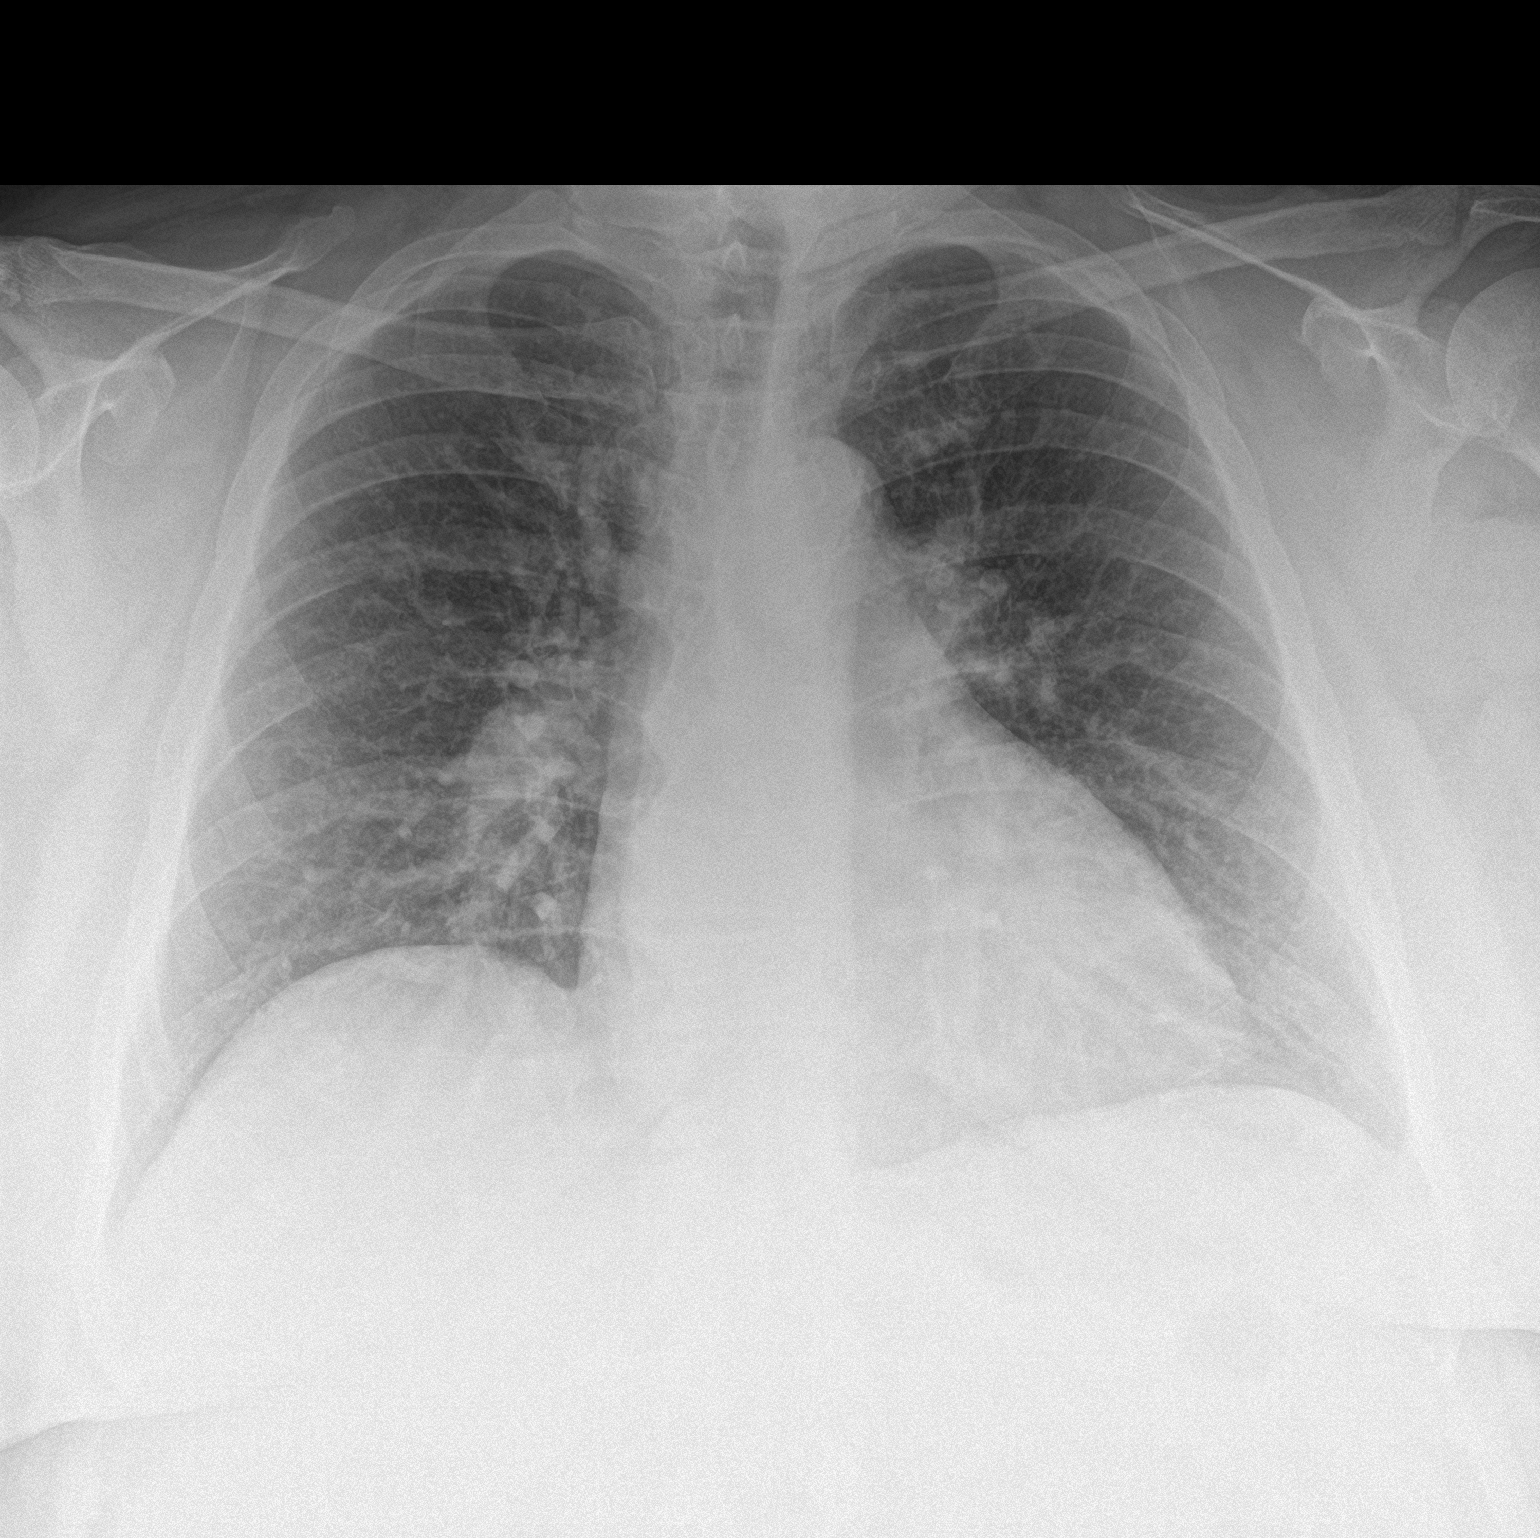

[chest lat]
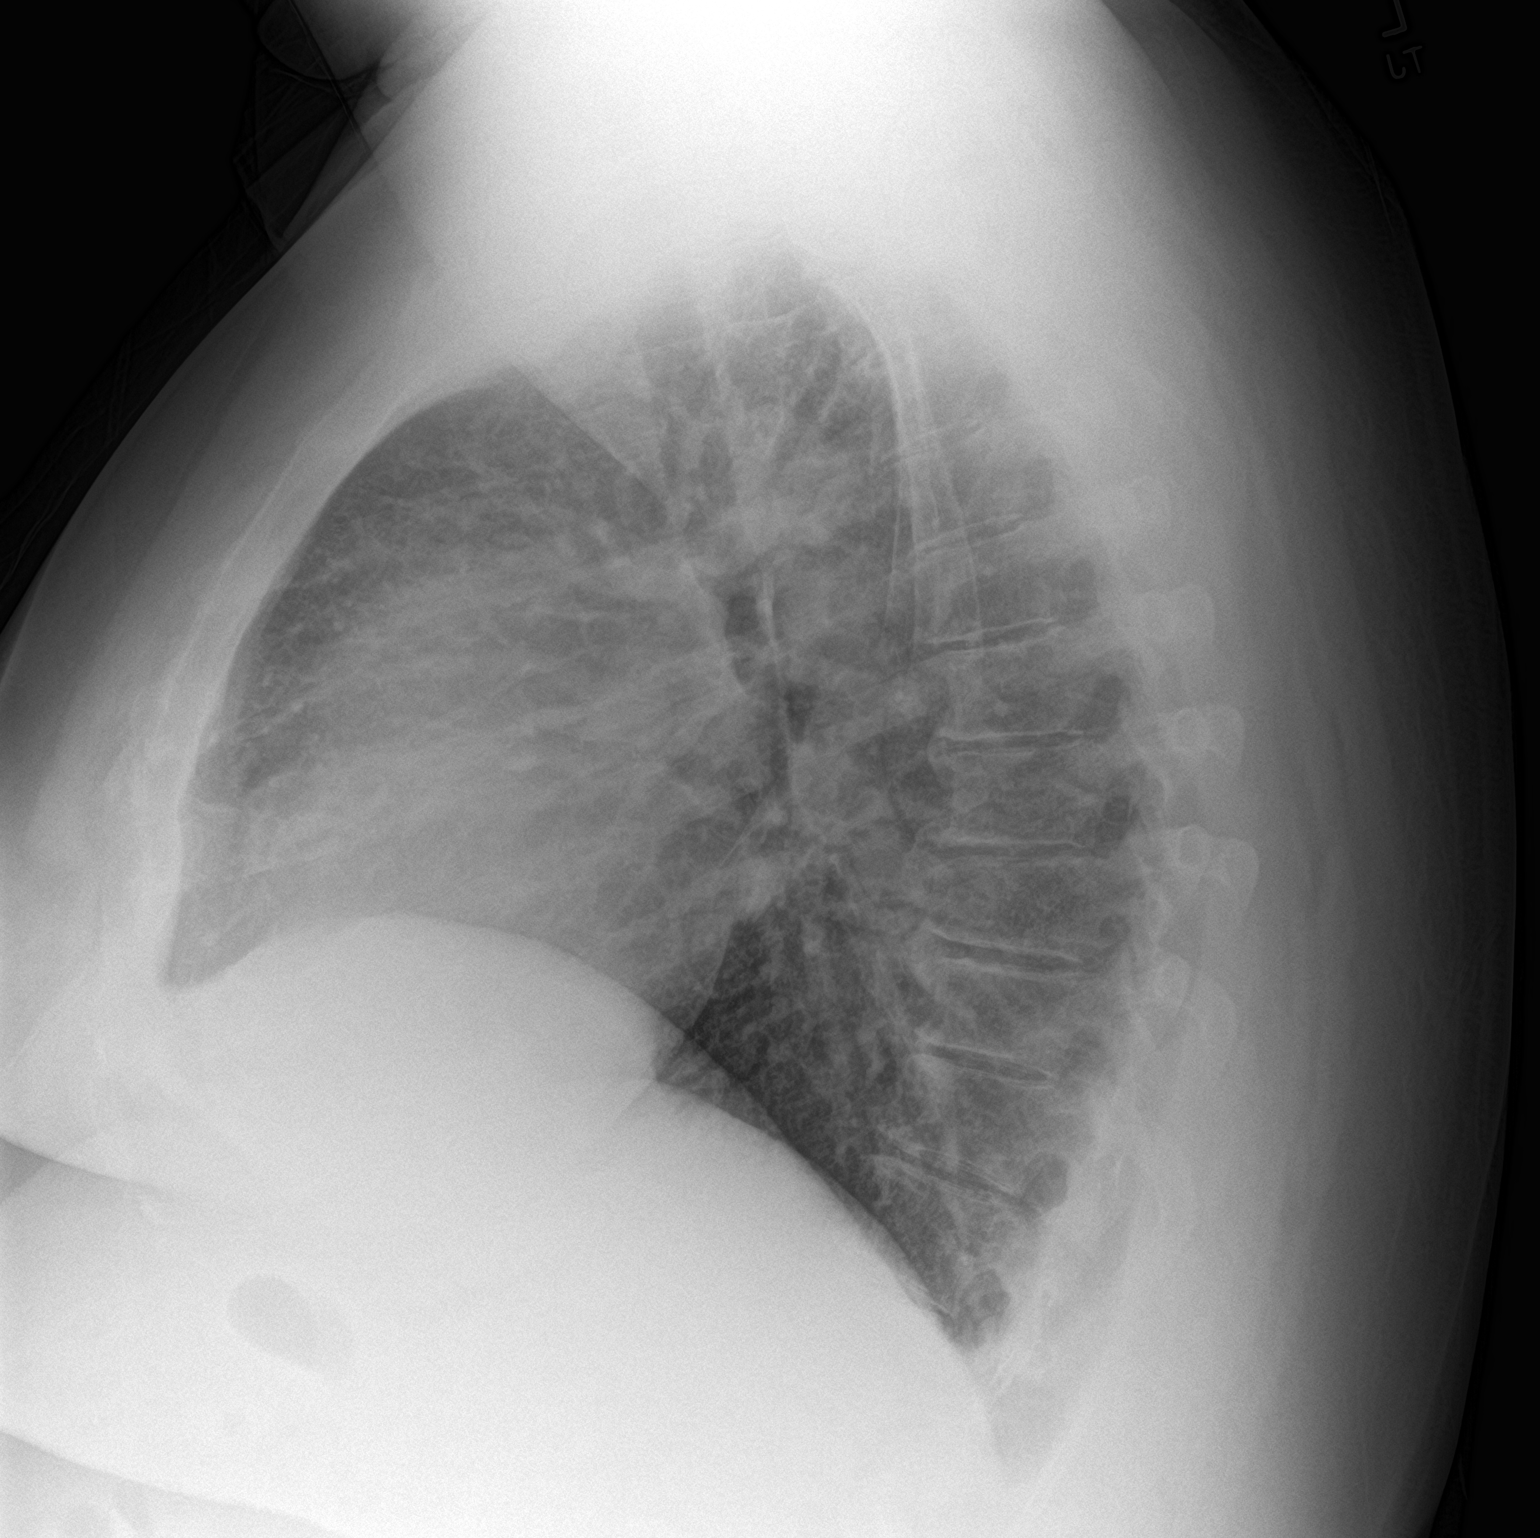

[2 of 2 positions shown; findings below may reference images not displayed]

FINDINGS: UPPER limits normal heart size and pulmonary vascular congestion
again noted.

Elevation of the RIGHT hemidiaphragm is unchanged.

There is no evidence of focal airspace disease, pulmonary edema,
suspicious pulmonary nodule/mass, pleural effusion, or pneumothorax.

No acute bony abnormalities are identified.
IMPRESSION: UPPER limits normal heart size with pulmonary vascular congestion.

## 2024-03-01 ENCOUNTER — Other Ambulatory Visit (HOSPITAL_COMMUNITY): Payer: Self-pay

## 2024-04-03 ENCOUNTER — Other Ambulatory Visit (HOSPITAL_COMMUNITY): Payer: Self-pay | Admitting: Internal Medicine

## 2024-04-03 DIAGNOSIS — I5022 Chronic systolic (congestive) heart failure: Secondary | ICD-10-CM

## 2024-04-05 ENCOUNTER — Telehealth (HOSPITAL_COMMUNITY): Payer: Self-pay | Admitting: Pharmacist

## 2024-04-05 NOTE — Telephone Encounter (Signed)
 Received refill request for Opsumit  from [facility]. Patient is no longer managed by Dr. Julane Ny, has been referred to [facility] and is managed by Dr. Vivienne Grove. Called [facility] and asked them to request refills for all PAH medications from [facility].  Luster Salters, PharmD, BCPS, BCCP, CPP Heart Failure Clinic Pharmacist [phone]

## 2024-07-03 ENCOUNTER — Encounter (HOSPITAL_COMMUNITY): Admitting: Internal Medicine
